# Patient Record
Sex: Male | Born: 1990 | Race: White | Hispanic: No | Marital: Married | Smoking: Former smoker
Health system: Southern US, Community
[De-identification: ages and names within clinical notes are randomized; demographics above are authoritative.]

## PROBLEM LIST (undated history)

## (undated) DIAGNOSIS — E785 Hyperlipidemia, unspecified: Secondary | ICD-10-CM

## (undated) DIAGNOSIS — J45909 Unspecified asthma, uncomplicated: Secondary | ICD-10-CM

## (undated) DIAGNOSIS — T1491XA Suicide attempt, initial encounter: Secondary | ICD-10-CM

## (undated) DIAGNOSIS — R413 Other amnesia: Secondary | ICD-10-CM

## (undated) DIAGNOSIS — G9332 Myalgic encephalomyelitis/chronic fatigue syndrome: Secondary | ICD-10-CM

## (undated) DIAGNOSIS — F319 Bipolar disorder, unspecified: Secondary | ICD-10-CM

## (undated) DIAGNOSIS — IMO0002 Reserved for concepts with insufficient information to code with codable children: Secondary | ICD-10-CM

## (undated) DIAGNOSIS — K589 Irritable bowel syndrome without diarrhea: Secondary | ICD-10-CM

## (undated) DIAGNOSIS — F909 Attention-deficit hyperactivity disorder, unspecified type: Secondary | ICD-10-CM

## (undated) DIAGNOSIS — F32A Depression, unspecified: Secondary | ICD-10-CM

## (undated) DIAGNOSIS — I471 Supraventricular tachycardia, unspecified: Secondary | ICD-10-CM

## (undated) DIAGNOSIS — Q249 Congenital malformation of heart, unspecified: Secondary | ICD-10-CM

## (undated) DIAGNOSIS — F329 Major depressive disorder, single episode, unspecified: Secondary | ICD-10-CM

## (undated) DIAGNOSIS — K2270 Barrett's esophagus without dysplasia: Secondary | ICD-10-CM

## (undated) HISTORY — PX: ABLATION: SHX5711

## (undated) HISTORY — DX: Congenital malformation of heart, unspecified: Q24.9

## (undated) HISTORY — DX: Major depressive disorder, single episode, unspecified: F32.9

## (undated) HISTORY — DX: Depression, unspecified: F32.A

## (undated) HISTORY — DX: Irritable bowel syndrome, unspecified: K58.9

## (undated) HISTORY — DX: Attention-deficit hyperactivity disorder, unspecified type: F90.9

## (undated) HISTORY — DX: Suicide attempt, initial encounter (CMS-HCC): T14.91XA

## (undated) HISTORY — DX: Unspecified asthma, uncomplicated: J45.909

## (undated) HISTORY — DX: Myalgic encephalomyelitis/chronic fatigue syndrome: G93.32

## (undated) HISTORY — DX: Other amnesia: R41.3

## (undated) HISTORY — DX: Reserved for concepts with insufficient information to code with codable children: IMO0002

## (undated) HISTORY — DX: Bipolar disorder, unspecified (CMS-HCC): F31.9

## (undated) HISTORY — DX: Hyperlipidemia, unspecified: E78.5

---

## 2017-06-11 NOTE — Progress Notes (Signed)
06-11-2017 Rcvd ER ref via [facility] in-basket for testicular pain, epididymal cyst seen in [facility] today. Has [employer]. Will see his PCM first.-Myrna N

## 2017-06-16 ENCOUNTER — Ambulatory Visit
Admit: 2017-06-16 | Discharge: 2017-06-16 | Payer: PRIVATE HEALTH INSURANCE | Attending: Gerontology | Primary: Family Medicine

## 2017-06-16 ENCOUNTER — Ambulatory Visit: Attending: Gerontology | Primary: Family Medicine

## 2017-06-16 DIAGNOSIS — N5082 Scrotal pain: Secondary | ICD-10-CM

## 2017-06-16 NOTE — Progress Notes (Signed)
1. Have you been to the ER, urgent care clinic since your last visit?  Hospitalized since your last visit?Yes When: LAST WEEK [facility]    2. Have you seen or consulted any other health care providers outside of the [facility] since your last visit?  Include any pap smears or colon screening. No    Chief Complaint   Patient presents with   ??? Penis Pain     seen last week at [facility]      Visit Vitals  BP 109/66 (BP 1 Location: Left arm, BP Patient Position: At rest)   Pulse 68   Temp 97.1 ??F (36.2 ??C)   Resp 16   Ht 5' 8" (1.727 m)   Wt 184 lb (83.5 kg)   SpO2 98%   BMI 27.98 kg/m??

## 2017-06-16 NOTE — Progress Notes (Signed)
[address]               [place], [place] [address]               [phone]      Casey Koch is a 27 y.o. male and presents with Penis Pain (seen last week at [facility] )         Subjective:    Scrotal pain  After driving for about an hour and felt pain in right scrotum and it shot up to his chest and down his leg  Has had this in the past, but this time the pain continues  The pain waxes and wanes  Went to ED on 06/11/17  US showed bilat 4mm epididymal cysts  Having difficulty urinating, this has been on going years, at least three  Has to bear down to urinate, but does not have to if he sits  Urine stream starts out weak and get strong  Urine is yellow in color  Endorses erection difficulty, able to ejaculate without problem  Has had ghonnorhea and chlamydia during early 20's 21-22yo  Unable to assess for difficulty defecating due to his PMH crohns    Cysts  Gets them frequenly in the pelvic and genital area  Currently has two, one is pink with no pain or drainage  Has been getting them since he was a child      Crohns  Having bloody stools  This is nothing new, it is due to his crohns per his report  States it is not an excessive worrisome amount of blooe  pain with bowel movements that has been for years due to his crohns        ROS:  As stated in HPI, otherwise all others negative.        ROS    The problem list was updated as a part of today's visit.  Patient Active Problem List   Diagnosis Code   ??? Crohn disease (HCC) K50.90   ??? Bipolar 1 disorder (HCC) F31.9   ??? SVT (supraventricular tachycardia) (HCC) I47.1   ??? Barrett esophagus K22.70   ??? PTSD (post-traumatic stress disorder) F43.10   ??? Sleep apnea G47.30   ??? Epidermoid cyst of skin L72.0   ??? Epididymal cyst N50.3       The PSH, FH were reviewed.      SH:  Social History     Tobacco Use   ??? Smoking status: Never Smoker   ??? Smokeless tobacco: Never Used   Substance Use Topics   ??? Alcohol use: Yes     Alcohol/week: 1.2 oz      Types: 2 Glasses of wine per week     Comment: SOCIALLY    ??? Drug use: Yes     Frequency: 7.0 times per week     Types: Marijuana       Medications/Allergies:  Current Outpatient Medications on File Prior to Visit   Medication Sig Dispense Refill   ??? ziprasidone (GEODON) 20 mg capsule Take 20 mg by mouth two (2) times daily (with meals).     ??? propranolol (INDERAL) 60 mg tablet Take 60 mg by mouth three (3) times daily. Indications: Prevention of Post Cardio-Thoracic Surgery Atrial Fibrillation     ??? pantoprazole (PROTONIX) 40 mg tablet Take 40 mg by mouth daily.     ??? diclofenac (VOLTAREN) 1 % gel Apply  to affected area four (4) times daily.  No current facility-administered medications on file prior to visit.         Allergies   Allergen Reactions   ??? Beef Containing Products Anaphylaxis   ??? Pork Derived (Porcine) Anaphylaxis   ??? Bee Pollen Rash   ??? Ibuprofen Nausea and Vomiting     Objective:  Visit Vitals  BP 109/66 (BP 1 Location: Left arm, BP Patient Position: At rest)   Pulse 68   Temp 97.1 ??F (36.2 ??C)   Resp 16   Ht  (1.727 m)   Wt 184 lb (83.5 kg)   SpO2 98%   BMI 27.98 kg/m??    Body mass index is 27.98 kg/m??.    Physical assessment  Physical Exam   Constitutional: He is oriented to person, place, and time and well-developed, well-nourished, and in no distress. Vital signs are normal.   HENT:   Head: Normocephalic and atraumatic.   Cardiovascular: Normal rate, regular rhythm and normal heart sounds.   Pulmonary/Chest: Effort normal and breath sounds normal.   Neurological: He is alert and oriented to person, place, and time. Gait normal.   Skin: Skin is warm, dry and intact.        Nursing note and vitals reviewed.        Labwork and Ancillary Studies:    CBC w/Diff  No results found for: WBC, WBCLT, HGB, HGBP, PLT, HGBEXT, PLTEXT, HGBEXT, PLTEXT      Basic Metabolic Profile  No results found for: NA, K, CL, CO2, AGAP, GLU, BUN, CREA, BUCR, GFRAA, GFRNA, CA, GFRAA     Cholesterol   No results found for: CHOL, CHOLX, CHLST, CHOLV, HDL, LDL, LDLC, DLDLP, TGLX, TRIGL, TRIGP, CHHD, CHHDX    Health Maintenance:   Health Maintenance   Topic Date Due   ??? DTaP/Tdap/Td series (1 - Tdap) 04/15/2011   ??? Influenza Age 52 to Adult  08/27/2017   ??? Pneumococcal 0-64 years  Aged Out       Assessment/Plan:    Diagnoses and all orders for this visit:    1. Scrotal pain  -     REFERRAL TO UROLOGY  -refer to urology for further management  -he also verbalized symptoms that could be related to prostate problems  -  2. Crohn's disease with rectal bleeding, unspecified gastrointestinal tract location [facility])   -this is a common problem for him, the bleeding at this time is not significant per his report, he does not think he needs to go to GI at this time    3. Epidermoid cyst of skin  Comments:  gets them frequently in the pelvic and perineum ever since he was a child  -these could be a cutaneous manifestatio of his crohn's disease  -refer to dermatology if he wishes to have them removed or they become bothersome    4. Epididymal cyst  Comments:  bilat 4mm cyst found on [place]  -found on [place] in ED          I have discussed the diagnosis with the patient and the intended plan as seen in the above orders.  The patient has received an After-Visit Summary and questions were answered concerning future plans.     An After Visit Summary was printed and given to the patient.    All diagnosis have been discussed with the patient and all of the patient's questions have been answered.     Follow-up and Dispositions    ?? Return if symptoms worsen or fail to improve.  Greater than 50% of the time was spent in counseling and coordination of care.       Manon Hilding, AGNP-BC  [facility]  [facility]   [address]  [place]  [address]

## 2017-06-16 NOTE — Progress Notes (Signed)
Progress Notes by Esperanza Sheets, NP at 06/16/17 1300                Author: Esperanza Sheets, NP  Service: --  Author Type: Nurse Practitioner       Filed: 06/17/17 1907  Encounter Date: 06/16/2017  Status: Signed          Editor: Esperanza Sheets, NP (Nurse Practitioner)                                 [address]                [place], [place] [address]                [phone]         Casey Koch is a 27 y.o.  male and presents with Penis Pain (seen last week at [facility] )             Subjective:      Scrotal pain   After driving for about an hour and felt pain in right scrotum and it shot up to his chest and down his leg   Has had this in the past, but this time the pain continues   The pain waxes and wanes   Went to ED on 06/11/17   US showed bilat 4mm epididymal cysts   Having difficulty urinating, this has been on going years, at least three   Has to bear down to urinate, but does not have to if he sits   Urine stream starts out weak and get strong   Urine is yellow in color   Endorses erection difficulty, able to ejaculate without problem   Has had ghonnorhea and chlamydia during early 20's 21-22yo   Unable to assess for difficulty defecating due to his PMH crohns      Cysts   Gets them frequenly in the pelvic and genital area   Currently has two, one is pink with no pain or drainage   Has been getting them since he was a child         Crohns   Having bloody stools   This is nothing new, it is due to his crohns per his report   States it is not an excessive worrisome amount of blooe   pain with bowel movements that has been for years due to his crohns            ROS:   As stated in HPI, otherwise all others negative.          ROS      The problem list was updated as a part of today's visit.     Patient Active Problem List        Diagnosis  Code         ?  Crohn disease (HCC)  K50.90     ?  Bipolar 1 disorder (HCC)  F31.9     ?  SVT (supraventricular tachycardia) (HCC)  I47.1      ?  Barrett esophagus  K22.70     ?  PTSD (post-traumatic stress disorder)  F43.10     ?  Sleep apnea  G47.30     ?  Epidermoid cyst of skin  L72.0         ?  Epididymal cyst  N50.3  The PSH, FH were reviewed.        SH:     Social History          Tobacco Use         ?  Smoking status:  Never Smoker     ?  Smokeless tobacco:  Never Used       Substance Use Topics         ?  Alcohol use:  Yes              Alcohol/week:  1.2 oz         Types:  2 Glasses of wine per week             Comment: SOCIALLY          ?  Drug use:  Yes              Frequency:  7.0 times per week              Types:  Marijuana           Medications/Allergies:     Current Outpatient Medications on File Prior to Visit          Medication  Sig  Dispense  Refill           ?  ziprasidone (GEODON) 20 mg capsule  Take 20 mg by mouth two (2) times daily (with meals).         ?  propranolol (INDERAL) 60 mg tablet  Take 60 mg by mouth three (3) times daily. Indications: Prevention of Post Cardio-Thoracic Surgery Atrial Fibrillation         ?  pantoprazole (PROTONIX) 40 mg tablet  Take 40 mg by mouth daily.               ?  diclofenac (VOLTAREN) 1 % gel  Apply  to affected area four (4) times daily.              No current facility-administered medications on file prior to visit.               Allergies        Allergen  Reactions         ?  Beef Containing Products  Anaphylaxis     ?  Pork Derived (Porcine)  Anaphylaxis     ?  Bee Pollen  Rash         ?  Ibuprofen  Nausea and Vomiting           Objective:   Visit Vitals      BP  109/66 (BP 1 Location: Left arm, BP Patient Position: At rest)     Pulse  68     Temp  97.1 ??F (36.2 ??C)     Resp  16     Ht   (1.727 m)     Wt  184 lb (83.5 kg)     SpO2  98%        BMI  27.98 kg/m??      Body mass index is 27.98 kg/m??.      Physical assessment   Physical Exam    Constitutional: He is oriented to person, place, and time and well-developed, well-nourished, and in no distress. Vital signs are normal.     HENT:    Head: Normocephalic and atraumatic.    Cardiovascular: Normal rate, regular rhythm and normal heart sounds.    Pulmonary/Chest: Effort normal and breath  sounds normal.    Neurological: He is alert and oriented to person, place, and time. Gait normal.    Skin: Skin is warm, dry and intact.           Nursing note and vitals reviewed.              Labwork and Ancillary Studies:      CBC w/Diff   No results found for: WBC, WBCLT, HGB, HGBP, PLT, HGBEXT, PLTEXT, HGBEXT, PLTEXT        Basic Metabolic Profile   No results found for: NA, K, CL, CO2, AGAP, GLU, BUN, CREA, BUCR, GFRAA, GFRNA, CA, GFRAA       Cholesterol   No results found for: CHOL, CHOLX, CHLST, CHOLV, HDL, LDL, LDLC, DLDLP, TGLX, TRIGL, TRIGP, CHHD, CHHDX      Health Maintenance:      Health Maintenance        Topic  Date Due         ?  DTaP/Tdap/Td series (1 - Tdap)  04/15/2011     ?  Influenza Age 45 to Adult   08/27/2017         ?  Pneumococcal 0-64 years   Aged Out           Assessment/Plan:     Diagnoses and all orders for this visit:      1. Scrotal pain   -     REFERRAL TO UROLOGY   -refer to urology for further management   -he also verbalized symptoms that could be related to prostate problems   -   2. Crohn's disease with rectal bleeding, unspecified gastrointestinal tract location [facility])    -this is a common problem for him, the bleeding at this time is not significant per his report, he does not think he needs to go to GI at this time      3. Epidermoid cyst of skin   Comments:   gets them frequently in the pelvic and perineum ever since he was a child   -these could be a cutaneous manifestatio of his crohn's disease   -refer to dermatology if he wishes to have them removed or they become bothersome      4. Epididymal cyst   Comments:   bilat 4mm cyst found on [place]   -found on [place] in ED               I have discussed the diagnosis with the patient and the intended plan as seen in the above orders.  The patient has received an After-Visit  Summary  and questions were answered concerning future plans.       An After Visit Summary was printed and given to the patient.      All diagnosis have been discussed with the patient and all of the patient's questions have been answered.         Follow-up and Dispositions      ??  Return if symptoms worsen or fail to improve.                Greater than 50% of the time was spent in counseling and coordination of care.          Manon Hilding, AGNP-BC   [facility]  [facility]   [address]  [place]  [address]

## 2017-06-16 NOTE — Progress Notes (Signed)
 1. Have you been to the ER, urgent care clinic since your last visit?  Hospitalized since your last visit?Yes When: LAST WEEK [facility]    2. Have you seen or consulted any other health care providers outside of the [facility] since your last visit?  Include any pap smears or colon screening. No    Chief Complaint   Patient presents with   . Penis Pain     seen last week at [facility]      Visit Vitals  BP 109/66 (BP 1 Location: Left arm, BP Patient Position: At rest)   Pulse 68   Temp 97.1 F (36.2 C)   Resp 16   Ht 5' 8 (1.727 m)   Wt 184 lb (83.5 kg)   SpO2 98%   BMI 27.98 kg/m

## 2017-06-17 DIAGNOSIS — K5090 Crohn's disease, unspecified, without complications: Secondary | ICD-10-CM | POA: Insufficient documentation

## 2017-06-17 DIAGNOSIS — L720 Epidermal cyst: Secondary | ICD-10-CM | POA: Insufficient documentation

## 2017-06-17 DIAGNOSIS — G4730 Sleep apnea, unspecified: Secondary | ICD-10-CM | POA: Insufficient documentation

## 2017-06-17 DIAGNOSIS — K2270 Barrett's esophagus without dysplasia: Secondary | ICD-10-CM | POA: Insufficient documentation

## 2017-06-17 DIAGNOSIS — N503 Cyst of epididymis: Secondary | ICD-10-CM | POA: Insufficient documentation

## 2017-06-23 ENCOUNTER — Ambulatory Visit: Admit: 2017-06-23 | Discharge: 2017-06-23 | Attending: Urology | Primary: Family Medicine

## 2017-06-23 ENCOUNTER — Ambulatory Visit: Attending: Urology | Primary: Family Medicine

## 2017-06-23 DIAGNOSIS — N50819 Testicular pain, unspecified: Secondary | ICD-10-CM

## 2017-06-23 NOTE — Progress Notes (Signed)
06/23/2017    Casey Koch  12/15/1990       ASSESSMENT:     ICD-10-CM ICD-9-CM    1. Orchalgia N50.819 608.9 REFERRAL TO PHYSICAL THERAPY ([facility])   2. Dysfunctional voiding of urine N39.8 599.9             PLAN:    Discussed benign exam and unremarkable [place] results.  Epididymal cysts are not the source of his pain.  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  Explained the role of PFPT in correcting the underlying pelvic floor issue.  Pt is amenable to attempting PFPT.  Order submitted.  Will follow up in 3 months if no improvement in symptoms.  Check PVR at next visit.      DISCUSSION:   Went into great depth about the pathophysiology involved in voiding dysfunction and pelvic floor tension.  Suspect that ongoing pelvic floor tension has led to this myofascial pain caused by chronically tightened muscles in and around the pelvis. Our natural protective instincts can tighten the pelvic basin, causing pain and other perplexing and distressing symptoms. Stress is intimately involved in creating and continuing these symptoms. Once the condition starts, the symptoms tend to have a life of their own.  A patient may feel generalized pain and discomfort around the pelvis, though pain may also be in and around the penis/testes, anal area or lower back. The pain or discomfort may be constant, or it may come and go. Sometimes the sensations are more severe during urination.     Reassured pt that there are many men with similar pelvic floor dysfunction.  There are several approaches to treating neuromuscular related pelvic pain caused by chronic contraction or spasm of the muscles of the pelvic floor. This contraction is perpetuated by a self feeding cycle of tension, anxiety, pain and protective guarding.  So, the first step in treatment is to relax and try not to focus on the pelvic symptoms.   Pt may attempt anti-inflammatories but the most effective treatment modality is likely PFPT (applying heat, pressure and/or massage to painful areas, and incorporating special exercises which can help stretch and strengthen the pelvic floor muscles).  Will re-evaluate after PT efforts.         Chief Complaint   Patient presents with   ??? Groin Pain     Pt was seen at [facility] for testicular pain and was informed that he had epididymal cyst. Feels like something is grabbing testicles and pulling. Other times pulsing, pressure. Pt states that with urination he will have to push real hard. Denies seeing any blood in his urine. Denies any trauma to the penies.        HISTORY OF PRESENT ILLNESS:  Casey Koch is a 27 y.o. Caucasian male who presents today for testicular pain x several months and recent disagnosis of epididymal cysts via [place]. Presented to ED on 06/11/17 c/o acute onset Rt testicular pain that began while driving that day. Scrotal US in ED showed bilateral epididymal head cysts, otherwise no abnormality. UA was negative for infection. Discharged on tramadol for his pain.  Reports some intermittent nausea associated with this pain. Typically pain lasts several hours and is intermittent in nature.  Described as aching/throbbing in quality.  Denies gross hematuria, fevers, chills, vomiting or bladder/perineal/flank pain.  No pelvic trauma reported.     Pt states that this pain has been occurring for several months but, prior to  06/11/17, has been very transient in nature, typically only occurring for several minutes at a time and spontaneously resolving without intervention.   Initially Rt sided but evolved to include bilateral testes.  He denies aggravating or alleviating factors and is unable to recall any precipitating events.  No increase in pain with voiding efforts; however he has experienced straining to void for most of his adult life (his "normal").  He states that he often has to push to get his  stream started but once it begins, he can empty well.  Denies pain with voiding.  Denies incontinence.  No reported UTIs as an adult.  No prior instrumentation or GU procedures in the past.    ??    REVIEW OF SYSTEMS:  Constitutional: Fever: No  Skin: Rash: Yes  left arm  HEENT: Hearing difficulty: No  Eyes: Blurred vision: No  Cardiovascular: Chest pain: No  Respiratory: Shortness of breath: Yes  Gastrointestinal: Nausea/vomiting: No  Musculoskeletal: Back pain: No  Neurological: Weakness: No  Psychological: Memory loss: No  Comments/additional findings:        Past Medical History:   Diagnosis Date   ??? Barrett esophagus    ??? Bipolar 1 disorder (HCC)    ??? Crohn disease (HCC)    ??? Crohn's disease (HCC)    ??? History of myocardial infarction 2018   ??? PTSD (post-traumatic stress disorder)    ??? Sleep apnea    ??? SVT (supraventricular tachycardia) (HCC)        Past Surgical History:   Procedure Laterality Date   ??? HX SVT ABLATION  2017   ??? HX SVT ABLATION         Social History     Tobacco Use   ??? Smoking status: Never Smoker   ??? Smokeless tobacco: Never Used   Substance Use Topics   ??? Alcohol use: Yes     Alcohol/week: 1.2 oz     Types: 2 Glasses of wine per week     Comment: SOCIALLY    ??? Drug use: Yes     Frequency: 7.0 times per week     Types: Marijuana       Allergies   Allergen Reactions   ??? Beef Containing Products Anaphylaxis   ??? Pork Derived (Porcine) Anaphylaxis   ??? Bee Pollen Rash   ??? Ibuprofen Nausea and Vomiting   ??? Nsaids (Non-Steroidal Anti-Inflammatory Drug) Nausea Only       Family History   Problem Relation Age of Onset   ??? Psychiatric Disorder Mother    ??? Alcohol abuse Mother    ??? Diabetes Father    ??? Asthma Father    ??? Migraines Father    ??? No Known Problems Brother        Current Outpatient Medications   Medication Sig Dispense Refill   ??? ziprasidone (GEODON) 20 mg capsule Take 20 mg by mouth two (2) times daily (with meals).     ??? propranolol (INDERAL) 60 mg tablet Take 60 mg by mouth three (3) times  daily. Indications: Prevention of Post Cardio-Thoracic Surgery Atrial Fibrillation     ??? pantoprazole (PROTONIX) 40 mg tablet Take 40 mg by mouth daily.     ??? diclofenac (VOLTAREN) 1 % gel Apply  to affected area four (4) times daily.           PHYSICAL EXAMINATION:   Visit Vitals  BP 120/74   Ht  (1.727 m)   Wt 184 lb (83.5 kg)  BMI 27.98 kg/m??     Constitutional: WDWN, Pleasant and appropriate affect, No acute distress.    CV:  No peripheral swelling noted  Respiratory: No respiratory distress or difficulties  Abdomen:  No abdominal tenderness. No CVA tenderness. No inguinal hernias noted.   GU Male:   SCROTUM:  No scrotal rash or lesions noticed.  Normal bilateral testes and epididymis. Lf epididymal cyst palpable, nttp; could not discern Rt epididymal cyst.  PENIS: Urethral meatus normal in location and size. No urethral discharge.  Skin: No jaundice.    Neuro/Psych:  Alert and oriented x 3, affect appropriate.   No inguinal adenopathy    REVIEW OF LABS AND IMAGING:      Scrotal US 06/11/17  EXAM: TESTICULAR ULTRASOUND  CLINICAL INDICATION/HISTORY: PAIN  ????> Additional: None  COMPARISON: None  _______________  FINDINGS:  RIGHT:   ????> Testicle: 3.8 x 3.0 x 1.6 cm. Normal echogenicity. No mass. Color and spectral Doppler demonstrate normal flow to the right testicle with no evidence of torsion. Arterial and venous waveforms are normal.  ????> Epididymis: There is a 4 mm epididymal head cyst.   ????> Hydrocele: None.   ????> Varicocele: None.  LEFT:   ????> Testicle: 4.0 x 3.1 x 1.7 cm. Normal echogenicity. No mass. Color and spectral Doppler demonstrate normal flow to the left testicle with no evidence of torsion. Arterial and venous waveforms are normal.  ????> Epididymis: There is a 4 mm epididymal head cyst.  ????> Hydrocele: None.   ????> Varicocele: None.  OTHER:   ????> No extratesticular scrotal mass.   ????> No scrotal wall edema.  ______________  IMPRESSION  Bilateral epididymal head cysts. No other abnormality.         A copy of today's office visit with all pertinent imaging results and labs were sent to the referring physician.      Patient's BMI is out of the normal parameters.  Information about BMI was given and patient was advised to follow-up with their PCP for further management.      Berenda Morale, PA     I have independently seen and examined the aformentioned patient. I agree with the history, physical, assesment and plan as documented above. Futheremore, I have addended the history, physical, assesment and plan above as needed in order to reflect my additional findings.   Jamey Reas, MD

## 2017-06-23 NOTE — Progress Notes (Signed)
Progress Notes by Jamey Reas, MD at 06/23/17 1100                Author: Jamey Reas, MD  Service: --  Author Type: Physician       Filed: 06/24/17 1220  Encounter Date: 06/23/2017  Status: Signed          Editor: Jamey Reas, MD (Physician)                    06/23/2017      Casey Koch   Jun 14, 1990          ASSESSMENT:              ICD-10-CM  ICD-9-CM             1.  Orchalgia  N50.819  608.9  REFERRAL TO PHYSICAL THERAPY ([facility])           2.  Dysfunctional voiding of urine  N39.8  599.9                    PLAN:     Discussed benign exam and unremarkable [place] results.  Epididymal cysts are not the source of his pain.  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  Explained the role of PFPT in correcting  the underlying pelvic floor issue.  Pt is amenable to attempting PFPT.  Order submitted.  Will follow up in 3 months if no improvement in symptoms.  Check PVR at next visit.         DISCUSSION:    Went into great depth about the pathophysiology involved in voiding dysfunction and pelvic floor tension.  Suspect that ongoing pelvic floor tension has led to this myofascial pain  caused by chronically tightened muscles in and around the pelvis. Our natural protective instincts can tighten the pelvic basin, causing pain and other perplexing and distressing symptoms. Stress is intimately involved in creating and continuing these  symptoms. Once the condition starts, the symptoms tend to have a life of their own.  A patient may feel generalized pain and discomfort around the pelvis, though pain may also be in and around the penis/testes, anal area or lower back. The pain or discomfort  may be constant, or it may come and go. Sometimes the sensations are more severe during urination.       Reassured pt that there are many men with similar pelvic floor dysfunction.  There are several approaches to treating neuromuscular related  pelvic pain caused by chronic contraction or spasm of the muscles of the pelvic floor. This contraction is perpetuated  by a self feeding cycle of tension, anxiety, pain and protective guarding.  So, the first step in treatment is to relax and try not to focus on the pelvic symptoms.  Pt may attempt anti-inflammatories but the most effective treatment modality is likely  PFPT (applying heat, pressure and/or massage to painful areas, and incorporating special exercises which can help stretch and strengthen the pelvic floor muscles).  Will re-evaluate after PT efforts.               Chief Complaint       Patient presents with        ?  Groin Pain             Pt was seen at [facility] for testicular pain and was informed that he had epididymal cyst. Feels  like something is grabbing testicles and pulling. Other  times pulsing, pressure. Pt states that with urination he will have to push real hard. Denies seeing any blood in his urine. Denies any trauma to the penies.            HISTORY OF PRESENT ILLNESS:  Casey Koch  is a 27 y.o. Caucasian male who  presents today for testicular pain x several months and recent disagnosis of epididymal cysts via [place]. Presented to ED on 06/11/17 c/o acute onset Rt testicular pain that began while driving that day. Scrotal US in ED showed bilateral epididymal head cysts,  otherwise no abnormality. UA was negative for infection. Discharged on tramadol for his pain.  Reports some intermittent nausea associated with this pain. Typically pain lasts several hours and is intermittent in nature.  Described as aching/throbbing  in quality.  Denies gross hematuria, fevers, chills, vomiting or bladder/perineal/flank pain.  No pelvic trauma reported.       Pt states that this pain has been occurring for several months but, prior to 06/11/17, has been very transient in nature, typically only occurring for several minutes at a time and spontaneously resolving without intervention.   Initially Rt sided but   evolved to include bilateral testes.  He denies aggravating or alleviating factors and is unable to recall any precipitating events.  No increase in pain with voiding efforts; however he has experienced straining to void for most of his adult life (his  "normal").  He states that he often has to push to get his stream started but once it begins, he can empty well.  Denies pain with voiding.  Denies incontinence.  No reported UTIs as an adult.  No prior instrumentation or GU procedures in the past.     ??      REVIEW OF SYSTEMS:   Constitutional: Fever: No   Skin: Rash: Yes   left arm   HEENT: Hearing difficulty: No   Eyes: Blurred vision: No   Cardiovascular: Chest pain: No   Respiratory: Shortness of breath: Yes   Gastrointestinal: Nausea/vomiting: No   Musculoskeletal: Back pain: No   Neurological: Weakness: No   Psychological: Memory loss: No   Comments/additional findings:             Past Medical History:        Diagnosis  Date         ?  Barrett esophagus       ?  Bipolar 1 disorder (HCC)       ?  Crohn disease (HCC)       ?  Crohn's disease (HCC)       ?  History of myocardial infarction  2018     ?  PTSD (post-traumatic stress disorder)       ?  Sleep apnea           ?  SVT (supraventricular tachycardia) (HCC)               Past Surgical History:         Procedure  Laterality  Date          ?  HX SVT ABLATION    2017          ?  HX SVT ABLATION                 Social History          Tobacco Use         ?  Smoking status:  Never Smoker     ?  Smokeless tobacco:  Never Used       Substance Use Topics         ?  Alcohol use:  Yes              Alcohol/week:  1.2 oz         Types:  2 Glasses of wine per week             Comment: SOCIALLY          ?  Drug use:  Yes              Frequency:  7.0 times per week              Types:  Marijuana             Allergies        Allergen  Reactions         ?  Beef Containing Products  Anaphylaxis     ?  Pork Derived (Porcine)  Anaphylaxis     ?  Bee Pollen  Rash     ?   Ibuprofen  Nausea and Vomiting         ?  Nsaids (Non-Steroidal Anti-Inflammatory Drug)  Nausea Only             Family History         Problem  Relation  Age of Onset          ?  Psychiatric Disorder  Mother       ?  Alcohol abuse  Mother       ?  Diabetes  Father       ?  Asthma  Father       ?  Migraines  Father            ?  No Known Problems  Brother               Current Outpatient Medications          Medication  Sig  Dispense  Refill           ?  ziprasidone (GEODON) 20 mg capsule  Take 20 mg by mouth two (2) times daily (with meals).         ?  propranolol (INDERAL) 60 mg tablet  Take 60 mg by mouth three (3) times daily. Indications: Prevention of Post Cardio-Thoracic Surgery Atrial Fibrillation         ?  pantoprazole (PROTONIX) 40 mg tablet  Take 40 mg by mouth daily.               ?  diclofenac (VOLTAREN) 1 % gel  Apply  to affected area four (4) times daily.                  PHYSICAL EXAMINATION:    Visit Vitals      BP  120/74     Ht   (1.727 m)     Wt  184 lb (83.5 kg)        BMI  27.98 kg/m??        Constitutional: WDWN, Pleasant and appropriate affect, No acute distress .     CV:  No peripheral swelling noted   Respiratory: No respiratory distress or difficulties   Abdomen:  No abdominal tenderness. No CVA tenderness. No inguinal hernias  noted.    GU Male:  SCROTUM:  No scrotal rash or lesions noticed.  Normal bilateral  testes and epididymis. Lf epididymal cyst palpable, nttp; could not discern Rt epididymal cyst.   PENIS: Urethral meatus normal in location and size. No urethral discharge .   Skin: No jaundice.     Neuro/Psych:  Alert and o riented x 3, affect appropriate.    No inguinal adenopathy      REVIEW OF LABS AND IMAGING:        Scrotal US 06/11/17   EXAM: TESTICULAR ULTRASOUND  CLINICAL INDICATION/HISTORY: PAIN  ????> Additional: None  COMPARISON: None  _______________  FINDINGS:  RIGHT:   ????> Testicle: 3.8  x 3.0 x 1.6 cm. Normal echogenicity. No mass. Color and spectral Doppler  demonstrate normal flow to the right testicle with no evidence of torsion. Arterial and venous waveforms are normal.  ????> Epididymis: There is a 4 mm epididymal head cyst.    ????> Hydrocele: None.   ????> Varicocele: None.  LEFT:   ????> Testicle: 4.0 x 3.1 x 1.7 cm. Normal echogenicity. No mass. Color and spectral Doppler demonstrate normal flow to the left testicle with no evidence of torsion. Arterial  and venous waveforms are normal.  ????> Epididymis: There is a 4 mm epididymal head cyst.  ????> Hydrocele: None.   ????> Varicocele: None.  OTHER:   ????> No extratesticular scrotal mass.   ????> No scrotal  wall edema.  ______________  IMPRESSION  Bilateral epididymal head cysts. No other abnormality.            A copy of today's office visit with all pertinent imaging results and labs were sent to the referring physician.        Patient's BMI is out of the normal parameters.  Information about BMI was given and patient was advised to follow-up with their PCP for further management.         Berenda Morale, PA       I have independently seen and examined the aformentioned patient. I agree with the history, physical, assesment and plan as documented above. [place], I have addended the history,  physical, assesment and plan above as needed in order to reflect my additional findings.    Jamey Reas, MD

## 2017-07-09 ENCOUNTER — Ambulatory Visit
Admit: 2017-07-09 | Discharge: 2017-07-09 | Payer: PRIVATE HEALTH INSURANCE | Attending: Family Medicine | Primary: Family Medicine

## 2017-07-09 DIAGNOSIS — F319 Bipolar disorder, unspecified: Secondary | ICD-10-CM

## 2017-07-09 MED ORDER — ZIPRASIDONE 20 MG CAP
20 mg | ORAL_CAPSULE | Freq: Two times a day (BID) | ORAL | 2 refills | Status: DC
Start: 2017-07-09 — End: 2017-07-15

## 2017-07-09 MED ORDER — DICLOFENAC 1 % TOPICAL GEL
1 % | Freq: Four times a day (QID) | CUTANEOUS | 3 refills | Status: DC
Start: 2017-07-09 — End: 2017-07-15

## 2017-07-09 NOTE — Progress Notes (Signed)
[facility]    CC: EOC    HPI:     Cardiac Arrhythmia:  -Cardiac ablation done 2017 due to SVT  -Has continued to have issues with a high HR that is associated with lightheadedness  -Work-up done showed PACs and an arrhythmia  -Has been raking propranolol for issue  -Last saw cardiologist in beginning of March      L2-3 and L5 Compression Fractures:  -2/2 falling out of window in 2015.  -Has had chronic low back pain since then  -Using Voltaren gel at night for issue  -Medication helps some      Bipolar   -Reported as being mixed. Having issue with both mania and depression   -Diagnosed in 2006  -Taking Geodon for issue  -Denies any side effects or issues with the medication  -Reports medication works well  -Currently not seeing therapist/psychiatrist/counselor      ROS: Positive items marked in RED  CON: fever, chills  Cardiovascular: palpitations, CP  Resp: SOB, cough  GI: nausea, vomiting, diarrhea  GU: dysuria, hematuria      Past Medical History:   Diagnosis Date   ??? Barrett esophagus    ??? Bipolar 1 disorder (HCC)    ??? Crohn disease (HCC)    ??? Crohn's disease (HCC)    ??? History of myocardial infarction 2018   ??? PTSD (post-traumatic stress disorder)    ??? Sleep apnea    ??? SVT (supraventricular tachycardia) (HCC)        Past Surgical History:   Procedure Laterality Date   ??? HX SVT ABLATION  2017   ??? HX SVT ABLATION         Family History   Problem Relation Age of Onset   ??? Psychiatric Disorder Mother    ??? Alcohol abuse Mother    ??? Diabetes Father    ??? Asthma Father    ??? Migraines Father    ??? No Known Problems Brother        Social History     Socioeconomic History   ??? Marital status: MARRIED     Spouse name: Not on file   ??? Number of children: Not on file   ??? Years of education: Not on file   ??? Highest education level: Not on file   Tobacco Use   ??? Smoking status: Never Smoker   ??? Smokeless tobacco: Never Used   Substance and Sexual Activity   ??? Alcohol use: Yes     Alcohol/week: 1.2 oz      Types: 2 Glasses of wine per week     Comment: SOCIALLY    ??? Drug use: Yes     Frequency: 7.0 times per week     Types: Marijuana   ??? Sexual activity: Yes     Partners: Male     Birth control/protection: None   Social History Narrative    ** Merged History Encounter **            Allergies   Allergen Reactions   ??? Bee Sting [Sting, Bee] Anaphylaxis     Not Bee Pollen   ??? Beef Containing Products Anaphylaxis   ??? Pork Derived (Porcine) Anaphylaxis   ??? Bee Pollen Rash   ??? Ibuprofen Nausea and Vomiting   ??? Nsaids (Non-Steroidal Anti-Inflammatory Drug) Nausea Only         Current Outpatient Medications:   ???  ziprasidone (GEODON) 20 mg capsule, Take 20 mg by mouth two (2) times daily (  with meals)., Disp: , Rfl:   ???  pantoprazole (PROTONIX) 40 mg tablet, Take 40 mg by mouth daily., Disp: , Rfl:   ???  diclofenac (VOLTAREN) 1 % gel, Apply  to affected area four (4) times daily., Disp: , Rfl:   ???  propranolol (INDERAL) 60 mg tablet, Take 60 mg by mouth three (3) times daily. Indications: Prevention of Post Cardio-Thoracic Surgery Atrial Fibrillation, Disp: , Rfl:     Physical Exam:      BP 128/81    Pulse 62    Temp 97.3 ??F (36.3 ??C) (Oral)    Resp (!) 63    Ht 5\' 8"  (1.727 m)    Wt 184 lb (83.5 kg)    SpO2 95%    BMI 27.98 kg/m??     General:  WD, WN, NAD, conversant  Eyes: sclera clear bilaterally, no discharge noted, eyelids normal in appearance  HENT: NCAT  Lungs: CTAB, normal respiratory effort and rate  CV: RRR, no MRGs  ABD: soft, non-tender, non-distended, normal bowel sounds  Skin: normal temperature, turgor, color, and texture  Psych: alert and oriented to person, place and situation, normal affect  Neuro: speech normal, moving all extremities, gait normal      Assessment/Plan     Bipolar 1 Disorder:  -Advised on importance of seeing mental health specialist  -Will continue current Geodon regimen  -Follow-up in 1 month      Cardiac Arrhythmia:  -Will refer to cardiology for evaluation  -Follow-up in 1 month       Chronic Low Back Pain:  -Will continue current Voltaren gel regimen  -Referred to PMR  -Follow-up in 1 month        Eugenie NorrieHiram Tanyla Stege, MD  07/09/2017, 12:57 PM

## 2017-07-09 NOTE — Progress Notes (Signed)
1. Have you been to the ER, urgent care clinic since your last visit?  Hospitalized since your last visit?No    2. Have you seen or consulted any other health care providers outside of the [facility] since your last visit?  Include any pap smears or colon screening. No

## 2017-07-09 NOTE — Progress Notes (Signed)
1. Have you been to the ER, urgent care clinic since your last visit?  Hospitalized since your last visit?No    2. Have you seen or consulted any other health care providers outside of the [facility] since your last visit?  Include any pap smears or colon screening. No

## 2017-07-09 NOTE — Progress Notes (Signed)
Progress  Notes by Casey Koch at 07/09/17 1230                Author: Eugenie Koch  Service: --  Author Type: Physician       Filed: 07/24/17 0635  Encounter Date: 07/09/2017  Status: Signed          Editor: [facility]      CC: EOC        HPI:        Cardiac Arrhythmia:   -Cardiac ablation done 2017 due to SVT   -Has continued to have issues with a high HR that is associated with lightheadedness   -Work-up done showed PACs and an arrhythmia   -Has been raking propranolol for issue   -Last saw cardiologist in beginning of March         L2-3 and L5 Compression Fractures:   -2/2 falling out of window in 2015.   -Has had chronic low back pain since then   -Using Voltaren gel at night for issue   -Medication helps some         Bipolar    -Reported as being mixed. Having issue with both mania and depression    -Diagnosed in 2006   -Taking Geodon for issue   -Denies any side effects or issues with the medication   -Reports medication works well   -Currently not seeing therapist/psychiatrist/counselor        ROS: Positive items marked in  RED   CON: fever, chills   Cardiovascular: palpitations, CP   Resp: SOB, cough   GI: nausea, vomiting, diarrhea   GU: dysuria, hematuria           Past Medical History:        Diagnosis  Date         ?  Barrett esophagus       ?  Bipolar 1 disorder (HCC)       ?  Crohn disease (HCC)       ?  Crohn's disease (HCC)       ?  History of myocardial infarction  2018     ?  PTSD (post-traumatic stress disorder)       ?  Sleep apnea           ?  SVT (supraventricular tachycardia) (HCC)               Past Surgical History:         Procedure  Laterality  Date          ?  HX SVT ABLATION    2017          ?  HX SVT ABLATION                 Family History         Problem  Relation  Age of Onset          ?  Psychiatric Disorder  Mother       ?  Alcohol abuse  Mother       ?  Diabetes  Father       ?  Asthma  Father       ?  Migraines   Father            ?  No Known Problems  Brother  Social History          Socioeconomic History         ?  Marital status:  MARRIED              Spouse name:  Not on file         ?  Number of children:  Not on file     ?  Years of education:  Not on file     ?  Highest education level:  Not on file       Tobacco Use         ?  Smoking status:  Never Smoker     ?  Smokeless tobacco:  Never Used       Substance and Sexual Activity         ?  Alcohol use:  Yes              Alcohol/week:  1.2 oz         Types:  2 Glasses of wine per week             Comment: SOCIALLY          ?  Drug use:  Yes              Frequency:  7.0 times per week         Types:  Marijuana         ?  Sexual activity:  Yes              Partners:  Male         Birth control/protection:  None       Social History Narrative          ** Merged History Encounter **                        Allergies        Allergen  Reactions         ?  Bee Sting [Sting, Bee]  Anaphylaxis             Not Bee Pollen         ?  Beef Containing Products  Anaphylaxis     ?  Pork Derived (Porcine)  Anaphylaxis     ?  Bee Pollen  Rash     ?  Ibuprofen  Nausea and Vomiting         ?  Nsaids (Non-Steroidal Anti-Inflammatory Drug)  Nausea Only              Current Outpatient Medications:    ?  ziprasidone (GEODON) 20 mg capsule, Take 20 mg by mouth two (2) times daily (with meals)., Disp: , Rfl:    ?  pantoprazole (PROTONIX) 40 mg tablet, Take 40 mg by mouth daily., Disp: , Rfl:    ?  diclofenac (VOLTAREN) 1 % gel, Apply  to affected area four (4) times daily., Disp: , Rfl:    ?  propranolol (INDERAL) 60 mg tablet, Take 60 mg by mouth three (3) times daily. Indications: Prevention of Post Cardio-Thoracic Surgery Atrial Fibrillation, Disp: , Rfl:         Physical Exam:         BP 128/81    Pulse 62    Temp 97.3 ??F (36.3 ??C) (Oral)    Resp (!) 63    Ht 5\' 8"  (1.727 m)    Wt 184 lb (83.5 kg)  SpO2 95%    BMI 27.98 kg/m??       General:  WD, WN, NAD, conversant   Eyes:  sclera clear bilaterally, no discharge noted, eyelids normal in appearance   HENT: NCA T   Lungs: CTAB, normal respiratory effort and rate   CV: RRR, no MRGs   ABD: soft,  non-tender, non-distended, normal bowel sounds   Skin: normal temperature, turgor, color, and texture   Psych: alert and oriented to person, place and situation, normal affect   Neuro: speech normal, moving all extremities, gait normal           Assessment/Plan        Bipolar 1 Disorder:   -Advised on importance of seeing mental health specialist   -Will continue current Geodon regimen   -Follow-up in 1 month         Cardiac Arrhythmia:   -Will refer to cardiology for evaluation   -Follow-up in 1 month         Chronic Low Back Pain:   -Will continue current Voltaren gel regimen   -Referred to PMR   -Follow-up in 1 month            Casey NorrieHiram Abdulrahim Siddiqi, MD   07/09/2017, 12:57 PM

## 2017-07-14 NOTE — Telephone Encounter (Signed)
Patient is requesting all his prescriptions to go to the [facility] on [address]   Phone number [phone].   Call the patient back at [phone].

## 2017-07-15 ENCOUNTER — Encounter

## 2017-07-15 NOTE — Telephone Encounter (Signed)
Patient is requesting all his prescriptions to go to the [facility] on [address]   Phone number [phone].   Call the patient back at [phone].

## 2017-07-15 NOTE — Telephone Encounter (Signed)
Patient called stating cant get out of bed, please forwards his medications accordingly.

## 2017-07-20 MED ORDER — PROPRANOLOL 60 MG TAB
60 mg | ORAL_TABLET | Freq: Every day | ORAL | 1 refills | Status: DC
Start: 2017-07-20 — End: 2017-08-18

## 2017-07-20 MED ORDER — DICLOFENAC 1 % TOPICAL GEL
1 % | Freq: Four times a day (QID) | CUTANEOUS | 3 refills | Status: AC
Start: 2017-07-20 — End: ?

## 2017-07-20 MED ORDER — PANTOPRAZOLE 40 MG TAB, DELAYED RELEASE
40 mg | ORAL_TABLET | Freq: Every day | ORAL | 1 refills | Status: AC
Start: 2017-07-20 — End: ?

## 2017-07-20 MED ORDER — ZIPRASIDONE 20 MG CAP
20 mg | ORAL_CAPSULE | Freq: Two times a day (BID) | ORAL | 1 refills | Status: AC
Start: 2017-07-20 — End: ?

## 2017-07-29 ENCOUNTER — Ambulatory Visit
Admit: 2017-07-29 | Discharge: 2017-07-29 | Attending: Rehabilitative and Restorative Service Providers" | Primary: Family Medicine

## 2017-07-29 ENCOUNTER — Ambulatory Visit: Attending: Adult Health | Primary: Family Medicine

## 2017-07-29 DIAGNOSIS — R102 Pelvic and perineal pain: Secondary | ICD-10-CM

## 2017-07-29 NOTE — Progress Notes (Signed)
Physical Therapy Dysfunctional Voiding Evaluation    Casey Koch, 27 y.o. , male presents today for evaluation.    Past Medical History:   Diagnosis Date   ??? Barrett esophagus    ??? Bipolar 1 disorder (HCC)    ??? Crohn disease (HCC)    ??? Crohn's disease (HCC)    ??? History of myocardial infarction 2018   ??? PTSD (post-traumatic stress disorder)    ??? Sleep apnea    ??? SVT (supraventricular tachycardia) (HCC)        Pt referred by Dr. Nada Boozer with a diagnosis of orchalgia and DV.  Pt's concerns are for L > R testicle pain. Pt reports onset 1 year ago which initially started as random L testicle pain; pain has no progressively worsened with increase in frequency and is now in both testicles though it is still worse on L. Pt also endorses difficulty with initiating urination and has strained to urinate for most of his life. Testicle pain has no pattern of aggravation and can occur randomly. Pt has a history of LBP after sustaining a compression fracture to L2, L3, and L5 in 2015. He was being treated for back pain in [place] prior to moving to the area. 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     Pt drinks 5-6 (64 oz) bottles water per day. Occasional drinks 1 cup/can coffee/soda.    Surgical and Medical History: Compression fracture to L2, L3, and L5 sustained 2015, cardiac ablasion 2017    Social History  working and lives with significant other    Precautions  none     Tests    scrotal US:  Bilateral epididymal head cysts. No other abnormality.      Current urinary complaint  difficulty with initiation of urine flow: with every effort    Bladder complaint longevity:  describes as "lifelong"    Bladder symptom progression:  the same    Pad use:  none, does not require    Pad wetness when changed:  NA    Daytime urinary frequency:  6-10x/ day    Nocturia:  0-2x/ night    Bowel function:  Regular BM, 5-7 per week    Pain complaint:  low back   testicular pain: left > right, post ejaculation    Pain scale:   Verbal Analog scale: average daily pain 3, best day 0, worst pain 9    Pain description:  Quality:  sharp  Behavior:  intermittent  Worsens with: urination, bowel movement and sexual activity (intercourse, ejaculation, orgasm)  Improves with: nothing, medication and lying on his stomach    Functional limitations:  cannot work/exercise without back pain  cannot be intimate with significant other without pain  ADLs limited by urinary urgency and frequency    Physical Exam    Pre Tx Sx: L testicle pain present, TTP to TLJ  Posture: decreased lumbar lordosis  Dermatomes: hyperalgesic to left L3, L4, L5, and right L4 (foot only)  Myotomes: decreased right L3, and left S1  SLR: not tested    LROM:   Flexion: deviates to left, painful arc  Extension: 75% loss, hinges at TLJ, ERP mainly at TLJ, mild to moderate pain at lower lumbar  R Sidebend: 50% limited  L Sidebend: 50% lmited     Movement test:  Prone: decreases testicle pain, better  Repeated lumbar extension in lying (REIL): x8, right L4 dermatome sensation WNL compared to left    Provisional classification:  Posterior  lumbar derangement with symmetric LE symptoms below the knee      External Palpation:  Connective tissue/myofascial restriction lumbar areas  Trigger points/hypertonicity/painful palpation:   left lumbar paraspinals  Lumbar spinous process and at TLJ        Pelvic floor manual exam:  Not tested this visit    Pelvic muscle release pattern  not tested this visit    EMG Evaluation of pelvic floor musculature    Not performed at this visit      Treatment and Education today:  Initiated pt education including anatomy and physiology of pelvic floor and it's relevance to bladder symptoms, pain.  Pt educated on spine and nerve anatomy and its relevance to their symptoms. Edu on centralization and peripheralization and instructed to discontinue exercise if symptoms peripheralize. Patient educated on  posture and using a lumbar roll for sitting to preserve lumbar lordosis. Patient verbalized understanding and agrees to this POC.  Initiated treatment techniques, including external pelvic floor self check and STM PRN, lumbar extension  Initiated HEP, to assist pt in taking an active role in rehabilitation and progress towards goals.  Pt given home program education re:  REIL x10 hourly (4- 5 times a day) in lying, or standing if unable to lie; instructions to discontinue if symptoms worsen.      Ther Ex 15 min  MT 10 min      Assessment  Jaris Kohles presents with the following behavioral/functional findings:   Nocturia  Poor understanding of the body and exercise physiology as it applies to his/her condition   No home exercise program  Poor knowledge of proper posture and body mechanics as it applies to his/her condition  Fitness exercise tolerance limited due to pain  Decreased tolerance to sexual activity due to pain    Rayshon Albaugh presents with the following findings for pelvic orthopedic and skeletal findings:  Musculoskeletal dysfunction that is contributing to pain  Abnormal muscle tension and trigger points contributing to pain and dysfunction    Tibor Lemmons presents with the following findings for pelvic floor motor function:  Not tested this visit due to orthopedic structural dysfunction      A current functional outcome assessment  was performed. The exam for functional outcome deficiencies was positive (abnormal exam).  Plan of care  was done..       Pt will benefit from skilled PT services to address the aforementioned symptoms. Good prognosis for goal achievement with regular therapy attendance and diligent home program completion.       Physical Therapy Short Term Goals to be achieved in 6 weeks:       1. Pt will demonstrate verbal understanding of good dietary habits including adequate fluid intake, dietary considerations for good bladder habits    2. Pt will be independent with HEP for improved  PFM control for contraction and release, improved bladder emptying, and decreased urinary urgency and frequency.  3. Pt will demonstrate proper isolated pelvic floor contraction and/or relaxation for progression towards LTG's      Physical therapy Long Term goals to be achieved in 12  Weeks:    1. Pt will demonstrate verbal understanding of good bladder toileting habits including relaxed voiding, double voiding, urge suppression strategies, bladder diary, timed voiding, and proper hygiene for long term control of condition.  2. Pt to report normalized voiding frequency: daytime intervals of 2-3 hours and 0-1 time per night for a more productive workday and restful sleep.  3. Pt will demonstrate  ability to maintain properly relaxed pelvic floor (below 1-162mV) at rest and during voiding for correction of voiding dysfunction.  4. Pt to demonstrate correction of urinary voiding dysfunction, as demonstrated by maintenance of relaxed pelvic floor on emg during voiding trial, and low PVR.  5. Pt will be independent with HEP for pain control, Relaxation, correction of voiding dysfunction, and improved  PFM control to have long term control of condition.    6. Pt will return to functional ADL's, not limited by urgency, frequency, pain.  7. Pt will return to all social activities, not limited by urgency, frequency, pain.  8. Pt will report ability to sleep restfully through night, not limited by nocturia.  9. Pt will report ability to return to work, not limited by urgency, frequency, pain.  10. Pt will report ability to continue/return to fitness routine, not limited by urgency, frequency, pain.  11. Patient will return to functional ADL's with decreased or no pain, including exercise, sexual activity and work functions.  12. Patient reports decrease of pain by 80- 100% through resolution of pelvic malalignment and instabilities, normalization of flexibility,  resolution of both internal and external muscle spasm and trigger points, and improved motor control and strength of pelvic musculature.        Plan    Ongoing pt education and HEP advancement  Therapeutic exercise  Neuromotor re-education  Manual therapy  Trigger Point Dry Needling  Progressive PME's  Modalities as needed for pain control (US, TENS, IFES, NMES, heat, ice)  Home rental or purchase TENS/IFES  Home rental neuromuscular electric stimulation for pelvic floor  EMG assessment  Biofeedback training  Bladder scan      Frequency:  Once a week or every other week for 12 weeks, with reevaluation every 10th visit        Next visit focus on:  emg  external MT  Assess response to REIL      UDI 6 and IIQ 7  07/29/2017   UDI 1 Freq urination 1   UDI 2 Leak - urgency 0   UDI 3 Leak - activity 0   UDI 4 Small leak 0   UDI 5 Difficulty empty 2   UDI 6 Pain abd or pelvis 2   UDI 6 Score 28   IIQ 1 Affected chores 1   IIQ 2 Affected recreation 1   IIQ 3 Affected entertainment 1   IIQ 4 Affected travel 1   IIQ 5 Affected social 1   IIQ 6 Affected emotional 0   IIQ 7 Frustration 1   IIQ 7 Score 29     CRADI-8 SCORE 07/29/2017   Do you feel you need to strain too hard to have a bowel movement? 2   Do you feel you have not completely emptied your bowels at the end of a bowel movement? 3   Do you usually lose stool beyond your control if your stool is well formed? 0   Do you usually lose stool beyond your control if your stool is loose? 0   Do you usually lose gas from your rectum beyond your control? 0   Do you usually have pain when you pass your stool? 2   Do you experience a strong sense of urgency and have to rush to the bathroom to have a bowel movement? 2   Does a part of your bowel ever pass through the rectum and bulge outside during or after a bowel movement? 0   CRADI-8  Score  9     Chronic Prostatitis Symptom Index for the Past Week 07/29/2017   Pain or discomfort in the area between rectum and testicles (perineum)? 0    Pain or discomfort in the testicles? 1   Pain or discomfort in the tip of the penis (not related to urination)? 0   Pain or discomfort below your waist, in your pubic or bladder area? 1   Pain or burning during urination? 1   Pain or discomfort during or after sexual climax? 1   How often have you had pain or discomfort in any of the above areas? 2   AVERAGE pain or discomfort level on the days you had it? 7   Pain or Discomfort Score 13   Have had a sensation of not emptying your bladder completely? 3   Have had to urinate again less that 2 hrs after urinating? 2   Urinary Symptom Score 5   Symptom Scale Score 18   Have your symptoms kept you from doing things you usually do? 2   How much did you think about your symptoms? 2   How would you feel about these symptoms for the rest of your life? 3   Quality of Life Score 7             Dr. Wyvonnia Dusky is available in clinic as incident to provider.    Time In: 1340  Time Out: 1445  Total Time (min): 65  Total Timed Codes MCR/BCBS (min): NA  Total 1:1 Time (min): NA    Corrinne Eagle PT, DPT, OCS  Physical Therapist  [facility]

## 2017-07-29 NOTE — Progress Notes (Signed)
 Physical Therapy Dysfunctional Voiding Evaluation    Casey Koch, 27 y.o. , male presents today for evaluation.    Past Medical History:   Diagnosis Date   . Barrett esophagus    . Bipolar 1 disorder (HCC)    . Crohn disease (HCC)    . Crohn's disease (HCC)    . History of myocardial infarction 2018   . PTSD (post-traumatic stress disorder)    . Sleep apnea    . SVT (supraventricular tachycardia) (HCC)        Pt referred by Dr. Genora with a diagnosis of orchalgia and DV.  Pt's concerns are for L > R testicle pain. Pt reports onset 1 year ago which initially started as random L testicle pain; pain has no progressively worsened with increase in frequency and is now in both testicles though it is still worse on L. Pt also endorses difficulty with initiating urination and has strained to urinate for most of his life. Testicle pain has no pattern of aggravation and can occur randomly. Pt has a history of LBP after sustaining a compression fracture to L2, L3, and L5 in 2015. He was being treated for back pain in [place]  prior to moving to the area. 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     Pt drinks 5-6 (64 oz) bottles water per day. Occasional drinks 1 cup/can coffee/soda.    Surgical and Medical History: Compression fracture to L2, L3, and L5 sustained 2015, cardiac ablasion 2017    Social History  working and lives with significant other    Precautions  none     Tests    scrotal US :  Bilateral epididymal head cysts. No other abnormality.      Current urinary complaint  difficulty with initiation of urine flow: with every effort    Bladder complaint longevity:  describes as lifelong    Bladder symptom progression:  the same    Pad use:  none, does not require    Pad wetness when changed:  NA    Daytime urinary frequency:  6-10x/ day    Nocturia:  0-2x/ night    Bowel function:  Regular BM, 5-7 per week    Pain complaint:  low back   testicular pain: left > right, post ejaculation    Pain  scale:  Verbal Analog scale: average daily pain 3, best day 0, worst pain 9    Pain description:  Quality:  sharp  Behavior:  intermittent  Worsens with: urination, bowel movement and sexual activity (intercourse, ejaculation, orgasm)  Improves with: nothing, medication and lying on his stomach    Functional limitations:  cannot work/exercise without back pain  cannot be intimate with significant other without pain  ADLs limited by urinary urgency and frequency    Physical Exam    Pre Tx Sx: L testicle pain present, TTP to TLJ  Posture: decreased lumbar lordosis  Dermatomes: hyperalgesic to left L3, L4, L5, and right L4 (foot only)  Myotomes: decreased right L3, and left S1  SLR: not tested    LROM:   Flexion: deviates to left, painful arc  Extension: 75% loss, hinges at TLJ, ERP mainly at TLJ, mild to moderate pain at lower lumbar  R Sidebend: 50% limited  L Sidebend: 50% lmited     Movement test:  Prone: decreases testicle pain, better  Repeated lumbar extension in lying (REIL): x8, right L4 dermatome sensation WNL compared to left    Provisional classification:  Posterior  lumbar derangement with symmetric LE symptoms below the knee      External Palpation:  Connective tissue/myofascial restriction lumbar areas  Trigger points/hypertonicity/painful palpation:   left lumbar paraspinals  Lumbar spinous process and at TLJ        Pelvic floor manual exam:  Not tested this visit    Pelvic muscle release pattern  not tested this visit    EMG Evaluation of pelvic floor musculature    Not performed at this visit      Treatment and Education today:  Initiated pt education including anatomy and physiology of pelvic floor and it's relevance to bladder symptoms, pain.  Pt educated on spine and nerve anatomy and its relevance to their symptoms. Edu on centralization and peripheralization and instructed to discontinue exercise if symptoms peripheralize. Patient educated on posture and using a lumbar roll for sitting to preserve  lumbar lordosis. Patient verbalized understanding and agrees to this POC.  Initiated treatment techniques, including external pelvic floor self check and STM PRN, lumbar extension  Initiated HEP, to assist pt in taking an active role in rehabilitation and progress towards goals.  Pt given home program education re:  REIL x10 hourly (4- 5 times a day) in lying, or standing if unable to lie; instructions to discontinue if symptoms worsen.      Ther Ex 15 min  MT 10 min      Assessment  Casey Koch presents with the following behavioral/functional findings:   Nocturia  Poor understanding of the body and exercise physiology as it applies to his/her condition   No home exercise program  Poor knowledge of proper posture and body mechanics as it applies to his/her condition  Fitness exercise tolerance limited due to pain  Decreased tolerance to sexual activity due to pain    Casey Koch presents with the following findings for pelvic orthopedic and skeletal findings:  Musculoskeletal dysfunction that is contributing to pain  Abnormal muscle tension and trigger points contributing to pain and dysfunction    Casey Koch presents with the following findings for pelvic floor motor function:  Not tested this visit due to orthopedic structural dysfunction      A current functional outcome assessment  was performed. The exam for functional outcome deficiencies was positive (abnormal exam).  Plan of care  was done..       Pt will benefit from skilled PT services to address the aforementioned symptoms. Good prognosis for goal achievement with regular therapy attendance and diligent home program completion.       Physical Therapy Short Term Goals to be achieved in 6 weeks:       1. Pt will demonstrate verbal understanding of good dietary habits including adequate fluid intake, dietary considerations for good bladder habits   2. Pt will be independent with HEP for improved  PFM control for contraction and release, improved  bladder emptying, and decreased urinary urgency and frequency.  3. Pt will demonstrate proper isolated pelvic floor contraction and/or relaxation for progression towards LTG's      Physical therapy Long Term goals to be achieved in 12  Weeks:    1. Pt will demonstrate verbal understanding of good bladder toileting habits including relaxed voiding, double voiding, urge suppression strategies, bladder diary, timed voiding, and proper hygiene for long term control of condition.  2. Pt to report normalized voiding frequency: daytime intervals of 2-3 hours and 0-1 time per night for a more productive workday and restful sleep.  3. Pt will demonstrate  ability to maintain properly relaxed pelvic floor (below 1-21mV) at rest and during voiding for correction of voiding dysfunction.  4. Pt to demonstrate correction of urinary voiding dysfunction, as demonstrated by maintenance of relaxed pelvic floor on emg during voiding trial, and low PVR.  5. Pt will be independent with HEP for pain control, Relaxation, correction of voiding dysfunction, and improved  PFM control to have long term control of condition.    6. Pt will return to functional ADL's, not limited by urgency, frequency, pain.  7. Pt will return to all social activities, not limited by urgency, frequency, pain.  8. Pt will report ability to sleep restfully through night, not limited by nocturia.  9. Pt will report ability to return to work, not limited by urgency, frequency, pain.  10. Pt will report ability to continue/return to fitness routine, not limited by urgency, frequency, pain.  11. Patient will return to functional ADL's with decreased or no pain, including exercise, sexual activity and work functions.  12. Patient reports decrease of pain by 80- 100% through resolution of pelvic malalignment and instabilities, normalization of flexibility, resolution of both internal and external muscle spasm and trigger points, and improved motor control and strength of  pelvic musculature.        Plan    Ongoing pt education and HEP advancement  Therapeutic exercise  Neuromotor re-education  Manual therapy  Trigger Point Dry Needling  Progressive PME's  Modalities as needed for pain control (US , TENS, IFES, NMES, heat, ice)  Home rental or purchase TENS/IFES  Home rental neuromuscular electric stimulation for pelvic floor  EMG assessment  Biofeedback training  Bladder scan      Frequency:  Once a week or every other week for 12 weeks, with reevaluation every 10th visit        Next visit focus on:  emg  external MT  Assess response to REIL      UDI 6 and IIQ 7  07/29/2017   UDI 1 Freq urination 1   UDI 2 Leak - urgency 0   UDI 3 Leak - activity 0   UDI 4 Small leak 0   UDI 5 Difficulty empty 2   UDI 6 Pain abd or pelvis 2   UDI 6 Score 28   IIQ 1 Affected chores 1   IIQ 2 Affected recreation 1   IIQ 3 Affected entertainment 1   IIQ 4 Affected travel 1   IIQ 5 Affected social 1   IIQ 6 Affected emotional 0   IIQ 7 Frustration 1   IIQ 7 Score 29     CRADI-8 SCORE 07/29/2017   Do you feel you need to strain too hard to have a bowel movement? 2   Do you feel you have not completely emptied your bowels at the end of a bowel movement? 3   Do you usually lose stool beyond your control if your stool is well formed? 0   Do you usually lose stool beyond your control if your stool is loose? 0   Do you usually lose gas from your rectum beyond your control? 0   Do you usually have pain when you pass your stool? 2   Do you experience a strong sense of urgency and have to rush to the bathroom to have a bowel movement? 2   Does a part of your bowel ever pass through the rectum and bulge outside during or after a bowel movement? 0   CRADI-8  Score  9     Chronic Prostatitis Symptom Index for the Past Week 07/29/2017   Pain or discomfort in the area between rectum and testicles (perineum)? 0   Pain or discomfort in the testicles? 1   Pain or discomfort in the tip of the penis (not related to urination)? 0    Pain or discomfort below your waist, in your pubic or bladder area? 1   Pain or burning during urination? 1   Pain or discomfort during or after sexual climax? 1   How often have you had pain or discomfort in any of the above areas? 2   AVERAGE pain or discomfort level on the days you had it? 7   Pain or Discomfort Score 13   Have had a sensation of not emptying your bladder completely? 3   Have had to urinate again less that 2 hrs after urinating? 2   Urinary Symptom Score 5   Symptom Scale Score 18   Have your symptoms kept you from doing things you usually do? 2   How much did you think about your symptoms? 2   How would you feel about these symptoms for the rest of your life? 3   Quality of Life Score 7             Dr. Buren Ned is available in clinic as incident to provider.    Time In: 1340  Time Out: 1445  Total Time (min): 65  Total Timed Codes MCR/BCBS (min): NA  Total 1:1 Time (min): NA    Orlean Drape PT, DPT, OCS  Physical Therapist  [facility] 

## 2017-08-07 ENCOUNTER — Ambulatory Visit: Admit: 2017-08-07 | Payer: PRIVATE HEALTH INSURANCE | Attending: Family Medicine | Primary: Family Medicine

## 2017-08-07 ENCOUNTER — Ambulatory Visit: Attending: Family Medicine | Primary: Family Medicine

## 2017-08-07 DIAGNOSIS — F319 Bipolar disorder, unspecified: Secondary | ICD-10-CM

## 2017-08-07 NOTE — Progress Notes (Signed)
[facility]    CC: F/U for chronic disease management    HPI:     Bipolar I:  -Taking Geodon as prescribed  -Denies any side effects or issues with the medication   -Reports that medication works well to control his symptoms  -Not being followed by psychiatry      Cardiac Arrhythmia:  -Taking propanolol as prescribed  -Denies any side effects or issues with the medication  -Reports medication seems to help control his HR  -Has not seen cardiologist      Chronic lower back pain:  -Using Voltaren as prescribed  -Denies any side effects or issues with medication  -Medication helps with pain   -Has not seen PMR      ROS: Positive items marked in RED  CON: fever, chills  Cardiovascular: palpitations, CP  Resp: SOB, cough  GI: nausea, vomiting, diarrhea  GU: dysuria, hematuria      Past Medical History:   Diagnosis Date   ??? Barrett esophagus    ??? Bipolar 1 disorder (HCC)    ??? Crohn's disease (HCC)    ??? History of myocardial infarction 2018   ??? PTSD (post-traumatic stress disorder)    ??? Sleep apnea    ??? SVT (supraventricular tachycardia) (HCC)        Past Surgical History:   Procedure Laterality Date   ??? HX SVT ABLATION  2017   ??? HX SVT ABLATION         Family History   Problem Relation Age of Onset   ??? Psychiatric Disorder Mother    ??? Alcohol abuse Mother    ??? Diabetes Father    ??? Asthma Father    ??? Migraines Father    ??? No Known Problems Brother        Social History     Socioeconomic History   ??? Marital status: MARRIED     Spouse name: Not on file   ??? Number of children: Not on file   ??? Years of education: Not on file   ??? Highest education level: Not on file   Tobacco Use   ??? Smoking status: Never Smoker   ??? Smokeless tobacco: Never Used   Substance and Sexual Activity   ??? Alcohol use: Yes     Alcohol/week: 2.0 standard drinks     Types: 2 Glasses of wine per week     Comment: SOCIALLY    ??? Drug use: Yes     Frequency: 7.0 times per week     Types: Marijuana   ??? Sexual activity: Yes     Partners: Male      Birth control/protection: None   Social History Narrative    ** Merged History Encounter **            Allergies   Allergen Reactions   ??? Bee Sting [Sting, Bee] Anaphylaxis     Not Bee Pollen   ??? Beef Containing Products Anaphylaxis   ??? Pork Derived (Porcine) Anaphylaxis   ??? Bee Pollen Rash   ??? Ibuprofen Nausea and Vomiting   ??? Nsaids (Non-Steroidal Anti-Inflammatory Drug) Nausea Only         Current Outpatient Medications:   ???  ziprasidone (GEODON) 20 mg capsule, Take 1 Cap by mouth two (2) times daily (with meals). Indications: Bipolar I Disorder with Most Recent Episode Mixed, Disp: 180 Cap, Rfl: 1  ???  diclofenac (VOLTAREN) 1 % gel, Apply 2 g to affected area four (4) times daily.,  Disp: 100 g, Rfl: 3  ???  pantoprazole (PROTONIX) 40 mg tablet, Take 1 Tab by mouth daily., Disp: 90 Tab, Rfl: 1  ???  propranolol LA (INDERAL LA) 60 mg SR capsule, Take 1 Cap by mouth daily., Disp: 90 Cap, Rfl: 1    Physical Exam:      BP 119/71    Pulse 70    Temp 97.1 ??F (36.2 ??C) (Oral)    Resp 12    Ht 5\' 8"  (1.727 m)    Wt 183 lb (83 kg)    SpO2 91%    BMI 27.83 kg/m??     General:  WD, WN, NAD, conversant  Eyes: sclera clear bilaterally, no discharge noted, eyelids normal in appearance  HENT: NCAT  Lungs: CTAB, normal respiratory effort and rate  CV: RRR, no MRGs  ABD: soft, non-tender, non-distended, normal bowel sounds  Ext: no peripheral edema or digital cyanosis noted  Skin: normal temperature, turgor, color, and texture  Psych: alert and oriented to person, place and situation, normal affect  Neuro: speech normal, moving all extremities, gait normal      Assessment/Plan     Bipolar 1 Disorder:  -Advised on importance of seeing mental health specialist  -Will continue current Geodon regimen  -Follow-up in 2 month  ??  ??  Cardiac Arrhythmia:  -Encouraged to see cardiology for evaluation  -Will continue current propanolol regimen  -Follow-up in 2 months  ??  ??  Chronic Low Back Pain:  -Will continue current Voltaren gel regimen   -Will defer further management/evaluation to PMR  -Follow-up in 2 months        Eugenie NorrieHiram Landon Bassford, MD  08/07/2017

## 2017-08-07 NOTE — Progress Notes (Signed)
1. Have you been to the ER, urgent care clinic since your last visit?  Hospitalized since your last visit?No    2. Have you seen or consulted any other health care providers outside of the [facility] since your last visit?  Include any pap smears or colon screening. No

## 2017-08-07 NOTE — Progress Notes (Signed)
Progress  Notes by Eugenie Norrie at 08/07/17 1600                Author: Eugenie Norrie  Service: --  Author Type: Physician       Filed: 09/07/17 0102  Encounter Date: 08/07/2017  Status: Signed          Editor: [facility]      CC: F/U for chronic disease management        HPI:        Bipolar I:   -Taking Geodon as prescribed   -Denies any side effects or issues with the medication    -Reports that medication works well to control his symptoms   -Not being followed by psychiatry         Cardiac Arrhythmia:   -Taking propanolol as prescribed   -Denies any side effects or issues with the medication   -Reports medication seems to help control his HR   -Has not seen cardiologist         Chronic lower back pain:   -Using Voltaren as prescribed   -Denies any side effects or issues with medication   -Medication helps with pain    -Has not seen PMR        ROS: Positive items marked in  RED   CON: fever, chills   Cardiovascular: palpitations, CP   Resp: SOB, cough   GI: nausea, vomiting, diarrhea   GU: dysuria, hematuria           Past Medical History:        Diagnosis  Date         ?  Barrett esophagus       ?  Bipolar 1 disorder (HCC)       ?  Crohn's disease (HCC)       ?  History of myocardial infarction  2018     ?  PTSD (post-traumatic stress disorder)       ?  Sleep apnea           ?  SVT (supraventricular tachycardia) (HCC)               Past Surgical History:         Procedure  Laterality  Date          ?  HX SVT ABLATION    2017          ?  HX SVT ABLATION                 Family History         Problem  Relation  Age of Onset          ?  Psychiatric Disorder  Mother       ?  Alcohol abuse  Mother       ?  Diabetes  Father       ?  Asthma  Father       ?  Migraines  Father            ?  No Known Problems  Brother               Social History          Socioeconomic History         ?  Marital status:  MARRIED  Spouse name:  Not on file          ?  Number of children:  Not on file     ?  Years of education:  Not on file     ?  Highest education level:  Not on file       Tobacco Use         ?  Smoking status:  Never Smoker     ?  Smokeless tobacco:  Never Used       Substance and Sexual Activity         ?  Alcohol use:  Yes              Alcohol/week:  2.0 standard drinks         Types:  2 Glasses of wine per week             Comment: SOCIALLY          ?  Drug use:  Yes              Frequency:  7.0 times per week         Types:  Marijuana         ?  Sexual activity:  Yes              Partners:  Male         Birth control/protection:  None       Social History Narrative          ** Merged History Encounter **                        Allergies        Allergen  Reactions         ?  Bee Sting [Sting, Bee]  Anaphylaxis             Not Bee Pollen         ?  Beef Containing Products  Anaphylaxis     ?  Pork Derived (Porcine)  Anaphylaxis     ?  Bee Pollen  Rash     ?  Ibuprofen  Nausea and Vomiting         ?  Nsaids (Non-Steroidal Anti-Inflammatory Drug)  Nausea Only              Current Outpatient Medications:    ?  ziprasidone (GEODON) 20 mg capsule, Take 1 Cap by mouth two (2) times daily (with meals). Indications: Bipolar I Disorder with Most Recent Episode Mixed, Disp: 180 Cap, Rfl: 1   ?  diclofenac (VOLTAREN) 1 % gel, Apply 2 g to affected area four (4) times daily., Disp: 100 g, Rfl: 3   ?  pantoprazole (PROTONIX) 40 mg tablet, Take 1 Tab by mouth daily., Disp: 90 Tab, Rfl: 1   ?  propranolol LA (INDERAL LA) 60 mg SR capsule, Take 1 Cap by mouth daily., Disp: 90 Cap, Rfl: 1        Physical Exam:         BP 119/71    Pulse 70    Temp 97.1 ??F (36.2 ??C) (Oral)    Resp 12    Ht 5\' 8"  (1.727 m)    Wt 183 lb (83 kg)    SpO2 91%    BMI 27.83 kg/m??       General:  WD, WN, NAD, conversant   Eyes: sclera clear bilaterally, no discharge noted, eyelids  normal in appearance   HENT: NCAT   Lungs: CTAB, normal respiratory effort and rate   CV: RRR, no MRGs   ABD: soft,   non-tender, non-distended, normal bowel sounds   Ext: no peripheral edema or digital cyanosis noted   Skin: normal temperature, turgor, color, and texture   Psych: alert and oriented to person, place and situation, normal affect   Neuro: speech normal, moving all extremities, gait normal           Assessment/Plan        Bipolar 1 Disorder:   -Advised on importance of seeing mental health specialist   -Will continue current Geodon regimen   -Follow-up in 2 month   ??   ??   Cardiac Arrhythmia:   -Encouraged to see cardiology for evaluation   -Will continue current propanolol regimen   -Follow-up in 2 months   ??   ??   Chronic Low Back Pain:   -Will continue current Voltaren gel regimen   -Will defer further management/evaluation to PMR   -Follow-up in 2 months            Eugenie NorrieHiram Llewyn Heap, MD   08/07/2017

## 2017-08-07 NOTE — Progress Notes (Signed)
1. Have you been to the ER, urgent care clinic since your last visit?  Hospitalized since your last visit?No    2. Have you seen or consulted any other health care providers outside of the [facility] since your last visit?  Include any pap smears or colon screening. No

## 2017-08-12 ENCOUNTER — Ambulatory Visit: Attending: Cardiovascular Disease | Primary: Family Medicine

## 2017-08-12 ENCOUNTER — Institutional Professional Consult (permissible substitution)
Admit: 2017-08-12 | Discharge: 2017-08-12 | Attending: Rehabilitative and Restorative Service Providers" | Primary: Family Medicine

## 2017-08-12 DIAGNOSIS — R102 Pelvic and perineal pain: Secondary | ICD-10-CM

## 2017-08-12 NOTE — Progress Notes (Signed)
Physical Therapy Treatment Note     Patient: Casey Koch    DOS: 08/12/2017 Visit #: 2      Subjective    Pt reports:  no significant changes  Tried to do HEP as best as possible; most days able to complete as instructed but some days only able to complete twice a day.       Objective  Diminished sensation to light touch to left L3, L4, and L5, and R L4 (foot)    Therapeutic exercise  10 min    Patient performed and was instructed in the home program for the following flexibility exercises: cobbler pose, happy baby, DKTC        EMG biofeedback/ Pelvic floor exercises  25 min (NMR)    Electrodes Used     perianal surface electrodes    Patient Position  sitting    Initial Resting Tone  2.5 mV    Reflexive Cough  7 mV    Quick Contractions  Not Tested    Sustained Contractions  5s hold/ 10s rest    Net Contractions  7mV  Resting Average  2.696mV  Max Contraction  35.8mV      Resting/ Release on EMG:  Hypertonic-elevated resting tone but good release with practice.   On visual observation, pt with perineal ascent with pelvic floor muscle contraction, delayed relaxation with no descent.      Casey Perlaicholas Burtch instructed in and performed home exercise program with a 3 second hold and a 15 second release for 10 repetitions in the seated or reclined position 3 times daily.    Pt was instructed in pelvic floor release exercises to be performed throughout the day. Pt will regularly assess pelvic floor tension and make conscious effort to allow a "release" of pelvic floor to establish more normal resting tone.     Patient instructed in role of diaphragm, pelvic floor and abdominals in pelvic floor mobility and and central nervous system relaxation. Tactile cueing at lower costals and diaphragm for appropriate technique and verbal cueing provided. Practiced focused diaphragmatic breathing with emphasis on release of lower abdominals and pelvic floor with inhale. Pt to  practice multiple times a day in various positions including supine, sit and stance.      Patient education  5 min    Pt given home program education ZO:[id number]:Progressive PME's, Pelvic muscle downtraining and "tone checks" daily, Progressive therapeutic stretching, Progressive relaxation techniques including diaphragmatic breathing, self external PFM massage, and relaxed bladder voiding       Assessment    Patient is consistent with HEP and appears motivated for improvement. Patient able to sense and coordinate PFM relaxation however unable to achieve descent and lengthening. Initiated stretches today with good response and issued to HEP. No change with lumbar dermatomes this visit but no worse with addition of stretches. Patient also instructed on external massage and will report back on his f/u visit if there is tenderness.      Plan    Patient will continue with HEP, with above treatment modifications  Consider next visit: increase PME's  Continued patient education regarding bladder habits, normal voiding patterns, dietary habits etc.  Progress therapeutic exercises as tolerated   Prep VT       Dr. Nada BoozerMcCammon is available in clinic as incident to provider.    Time In: 1540  Time Out: 1620  Total Time (min): 40  Total Timed Codes MCR/BCBS (min): 40  Total 1:1 Time (min): 40    Talbert ForestShirley  Avel Sensor, DPT, OCS  Physical Therapist  [facility]

## 2017-08-12 NOTE — Progress Notes (Signed)
 Physical Therapy Treatment Note     Patient: Casey Koch    DOS: 08/12/2017 Visit #: 2      Subjective    Pt reports:  no significant changes  Tried to do HEP as best as possible; most days able to complete as instructed but some days only able to complete twice a day.       Objective  Diminished sensation to light touch to left L3, L4, and L5, and R L4 (foot)    Therapeutic exercise  10 min    Patient performed and was instructed in the home program for the following flexibility exercises: cobbler pose, happy baby, DKTC        EMG biofeedback/ Pelvic floor exercises  25 min (NMR)    Electrodes Used     perianal surface electrodes    Patient Position  sitting    Initial Resting Tone  2.5 mV    Reflexive Cough  7 mV    Quick Contractions  Not Tested    Sustained Contractions  5s hold/ 10s rest    Net Contractions  7mV  Resting Average  2.34mV  Max Contraction  35.8mV      Resting/ Release on EMG:  Hypertonic-elevated resting tone but good release with practice.   On visual observation, pt with perineal ascent with pelvic floor muscle contraction, delayed relaxation with no descent.      Casey Koch instructed in and performed home exercise program with a 3 second hold and a 15 second release for 10 repetitions in the seated or reclined position 3 times daily.    Pt was instructed in pelvic floor release exercises to be performed throughout the day. Pt will regularly assess pelvic floor tension and make conscious effort to allow a release of pelvic floor to establish more normal resting tone.     Patient instructed in role of diaphragm, pelvic floor and abdominals in pelvic floor mobility and and central nervous system relaxation. Tactile cueing at lower costals and diaphragm for appropriate technique and verbal cueing provided. Practiced focused diaphragmatic breathing with emphasis on release of lower abdominals and pelvic floor with inhale. Pt to practice multiple times a day in various positions including  supine, sit and stance.      Patient education  5 min    Pt given home program education mz:Emnhmzddpcz PME's, Pelvic muscle downtraining and tone checks daily, Progressive therapeutic stretching, Progressive relaxation techniques including diaphragmatic breathing, self external PFM massage, and relaxed bladder voiding       Assessment    Patient is consistent with HEP and appears motivated for improvement. Patient able to sense and coordinate PFM relaxation however unable to achieve descent and lengthening. Initiated stretches today with good response and issued to HEP. No change with lumbar dermatomes this visit but no worse with addition of stretches. Patient also instructed on external massage and will report back on his f/u visit if there is tenderness.      Plan    Patient will continue with HEP, with above treatment modifications  Consider next visit: increase PME's  Continued patient education regarding bladder habits, normal voiding patterns, dietary habits etc.  Progress therapeutic exercises as tolerated   Prep VT       Dr. Genora is available in clinic as incident to provider.    Time In: 1540  Time Out: 1620  Total Time (min): 40  Total Timed Codes MCR/BCBS (min): 40  Total 1:1 Time (min): 40    Orlean  Bennet KLEIN, DPT, OCS  Physical Therapist  [facility] 

## 2017-08-17 NOTE — Telephone Encounter (Signed)
Prescription inderal 60 mg tablets  .Marland Kitchen.Marland Kitchen.Marland Kitchen.Marland Kitchen.patient has been taking the  Inderal ER 60 mg tablets.     Lisa at [phone] .... Pharmacy is inquiring if change or mishap.

## 2017-08-18 ENCOUNTER — Encounter

## 2017-08-18 ENCOUNTER — Institutional Professional Consult (permissible substitution): Admit: 2017-08-18 | Attending: Rehabilitative and Restorative Service Providers" | Primary: Family Medicine

## 2017-08-18 DIAGNOSIS — R102 Pelvic and perineal pain: Secondary | ICD-10-CM

## 2017-08-18 NOTE — Progress Notes (Signed)
Physical Therapy Treatment Note     Patient: Casey Koch    DOS: 08/18/2017 Visit #: 3      Subjective    Pt reports:  Urination has been easier; has bene practicing relaxed voiding.   Performing stretches consistently.  No change in testicle pain despite focusing on relaxation. States testicle pain is closer to the top of the testicle running upwards.   Went to the ER 1-2 days after last appointment for heat exhaustion and dehydration but this did not worsen symptoms.       Objective    Therapeutic exercise  25 min    Patient performed and was instructed in the home program for the following flexibility exercises:  kneeling thoracic extension mob with dowel and "open book" thoracic rotation in sidelying to improve spinal mobility and reduce spinal nerve compression.         Modalities    Moist heat used pre treatment to pelvic region in sitting position for 10 minutes      Patient education  5 min    Pt given home program education ZO:[id number]:Progressive therapeutic stretching   Pt educated on spine and nerve anatomy and its relevance to their symptoms.       Assessment    Patient reports improved bladder control, including improved sense of emptying  Patient is consistent with HEP and appears motivated for improvement   No change with PFM relaxation as patient does adhere to HEP compliance. Patient instructed on and performed thoracic mobility exercises today and issued to HEP to address movement dysfunction at T12-L1; given patient's history of L2-L3 fracture, patient's testicle pain may be related to dermatomal referral. Patient will f/u in 2 weeks to assess symptoms with addition of thoracic mobility. Patient was educated and instructed to discontinue if symptoms worsen.      Plan    Patient will continue with HEP, with above treatment modifications  Voiding assessment next visit        Dr. Nada BoozerMcCammon is available in clinic as incident to provider.    Time In: 1600  Time Out: 1640  Total Time (min): 40   Total Timed Codes MCR/BCBS (min): 30  Total 1:1 Time (min): 30    Corrinne EagleShirley Aric Jost PT, DPT, OCS  Physical Therapist  [facility]

## 2017-08-18 NOTE — Progress Notes (Signed)
 Physical Therapy Treatment Note     Patient: Casey Koch    DOS: 08/18/2017 Visit #: 3      Subjective    Pt reports:  Urination has been easier; has bene practicing relaxed voiding.   Performing stretches consistently.  No change in testicle pain despite focusing on relaxation. States testicle pain is closer to the top of the testicle running upwards.   Went to the ER 1-2 days after last appointment for heat exhaustion and dehydration but this did not worsen symptoms.       Objective    Therapeutic exercise  25 min    Patient performed and was instructed in the home program for the following flexibility exercises:  kneeling thoracic extension mob with dowel and open book thoracic rotation in sidelying to improve spinal mobility and reduce spinal nerve compression.         Modalities    Moist heat used pre treatment to pelvic region in sitting position for 10 minutes      Patient education  5 min    Pt given home program education mz:Emnhmzddpcz therapeutic stretching   Pt educated on spine and nerve anatomy and its relevance to their symptoms.       Assessment    Patient reports improved bladder control, including improved sense of emptying  Patient is consistent with HEP and appears motivated for improvement   No change with PFM relaxation as patient does adhere to HEP compliance. Patient instructed on and performed thoracic mobility exercises today and issued to HEP to address movement dysfunction at T12-L1; given patient's history of L2-L3 fracture, patient's testicle pain may be related to dermatomal referral. Patient will f/u in 2 weeks to assess symptoms with addition of thoracic mobility. Patient was educated and instructed to discontinue if symptoms worsen.      Plan    Patient will continue with HEP, with above treatment modifications  Voiding assessment next visit        Dr. Genora is available in clinic as incident to provider.    Time In: 1600  Time Out: 1640  Total Time (min): 40  Total Timed  Codes MCR/BCBS (min): 30  Total 1:1 Time (min): 30    Orlean Drape PT, DPT, OCS  Physical Therapist  [facility] 

## 2017-08-19 MED ORDER — PROPRANOLOL 60 MG 24 HR SUSTAINED ACTION CAP
60 mg | ORAL_CAPSULE | Freq: Every day | ORAL | 1 refills | Status: AC
Start: 2017-08-19 — End: ?

## 2017-09-01 ENCOUNTER — Institutional Professional Consult (permissible substitution)
Admit: 2017-09-01 | Discharge: 2017-09-01 | Attending: Rehabilitative and Restorative Service Providers" | Primary: Family Medicine

## 2017-09-01 DIAGNOSIS — R102 Pelvic and perineal pain: Secondary | ICD-10-CM

## 2017-09-01 NOTE — Progress Notes (Signed)
Physical Therapy Treatment Note     Patient: Casey Koch    DOS: 09/01/2017 Visit #: 4      Subjective    Pt reports:  Voiding better and is relaxing versus pushing most of the time.  States having increased testicle pain and back pain since 4 days ago; states he stretched his neck and it popped and sent pain down his entire back. States both pain has been increased since then.     Objective  Therapeutic exercise  15 min    Patient performed and was instructed in the home program for the following flexibility exercises: cat/camel and child pose 3 way. Pt educated on use of breath and rib expansion during stretching for thoracic and ribcage mobility.      Attempted prone Ts however thoracic pain provoked thus discontinued.         EMG biofeedback/ Pelvic floor exercises  15 min (NMR)      Voiding Trial  Pre void volume:    64 ml  Post void volume:  Not taken    Voiding Assessment:  voided with EMG set up utilizing perianal electrodes in restroom with visual feedback for normal motor function training., was educated regarding normal release pattern and advised to attempt to release pelvic floor/ sphincter. and voided with a normal release pattern.     Patient education  5 min    Pt given home program education RU:[id number]:Progressive therapeutic stretching and Progressive relaxation techniques including diaphragmatic breathing       Assessment    Patient  reports minimal change in pain from initiation of treatment  Patient is consistent with HEP and appears motivated for improvement   Patient with increased back pain today however aggravating factor seems to be from his neck. Focus on thoracic mobility in mid range today with good tolerance and issued to HEP to address spinal mobility. Voiding test today showed good pelvic floor release during void despite patient stating he had no urge. Possible referral for back neck and thoracic pain to be addressed if patient does not improve with his testicle pain as this  recent flair up seems related to his upper thoracic overflowing into mid to low thoracic spine to the T12-L1 dermatome.      Plan    Progress therapeutic exercises as tolerated      Dr. Nada BoozerMcCammon is available in clinic as incident to provider.    Time In: 1450  Time Out: 1530  Total Time (min): 40  Total Timed Codes MCR/BCBS (min): 30  Total 1:1 Time (min): 30    Corrinne EagleShirley Shaquera Ansley PT, DPT, OCS  Physical Therapist  [facility]

## 2017-09-01 NOTE — Progress Notes (Signed)
Physical Therapy Treatment Note     Patient: Casey Koch    DOS: 09/01/2017 Visit #: 4      Subjective    Pt reports:  Voiding better and is relaxing versus pushing most of the time.  States having increased testicle pain and back pain since 4 days ago; states he stretched his neck and it popped and sent pain down his entire back. States both pain has been increased since then.     Objective  Therapeutic exercise  15 min    Patient performed and was instructed in the home program for the following flexibility exercises: cat/camel and child pose 3 way. Pt educated on use of breath and rib expansion during stretching for thoracic and ribcage mobility.      Attempted prone Ts however thoracic pain provoked thus discontinued.         EMG biofeedback/ Pelvic floor exercises  15 min (NMR)      Voiding Trial  Pre void volume:    64 ml  Post void volume:  Not taken    Voiding Assessment:  voided with EMG set up utilizing perianal electrodes in restroom with visual feedback for normal motor function training., was educated regarding normal release pattern and advised to attempt to release pelvic floor/ sphincter. and voided with a normal release pattern.     Patient education  5 min    Pt given home program education ZO:[id number]:Progressive therapeutic stretching and Progressive relaxation techniques including diaphragmatic breathing       Assessment    Patient  reports minimal change in pain from initiation of treatment  Patient is consistent with HEP and appears motivated for improvement   Patient with increased back pain today however aggravating factor seems to be from his neck. Focus on thoracic mobility in mid range today with good tolerance and issued to HEP to address spinal mobility. Voiding test today showed good pelvic floor release during void despite patient stating he had no urge. Possible referral for back neck and thoracic pain to be addressed if patient does not improve with his testicle pain as this recent flair up  seems related to his upper thoracic overflowing into mid to low thoracic spine to the T12-L1 dermatome.      Plan    Progress therapeutic exercises as tolerated      Dr. Nada BoozerMcCammon is available in clinic as incident to provider.    Time In: 1450  Time Out: 1530  Total Time (min): 40  Total Timed Codes MCR/BCBS (min): 30  Total 1:1 Time (min): 30    [name] PT, DPT, OCS  Physical Therapist  [facility]

## 2017-09-15 ENCOUNTER — Institutional Professional Consult (permissible substitution)
Admit: 2017-09-15 | Discharge: 2017-09-15 | Attending: Rehabilitative and Restorative Service Providers" | Primary: Family Medicine

## 2017-09-15 DIAGNOSIS — R102 Pelvic and perineal pain: Secondary | ICD-10-CM

## 2017-09-15 NOTE — Progress Notes (Signed)
Physical Therapy Treatment Note     Patient: Casey Koch    DOS: 09/15/2017 Visit #: 5      Subjective    Pt reports:  Neck pain is resolving, testicle pain not present today but occurred yesterday while sitting. Has been performing PF massage but only 2x/week.       Objective  Tenderness to left external pelvic floor, reproduction of R testicle pain with down training    External Manual Therapy  30 min    trigger point release, soft tissue mobilization and myofascial release to the following areas: bilat external pelvic floor, focusing more on left, STM/MFR to suprapubic fascia  response to external manual therapy: good reduction in palpable spasm with patient reporting diminished pain        Pelvic floor exercises  10 min (NMR)    Pt verbalized consent for external pelvic floor exam and consented for use of tactile cuing to the perineum to improve pelvic floor awareness for contraction and relaxation of the urogenital triangle. Pt instructed on use of a mirror to provide visual cues or using their hand at their perineum to improve awareness and proprioception of their pelvic floor during contraction and relaxation with focus on pelvic floor lengthening during relaxation.            Modalities    Moist heat used pre and post treatment to pelvic region in supine position for 10 minutes before and after      Patient education   During MT    Pt given home program education re:Pelvic muscle downtraining and "tone checks" daily, Self trigger point release and external manual therapy methods and Use of heat and/or cold for pain management       Assessment    Good response to manual treatment with reduction of muscle tension to the external pelvic floor with production of R anterior testicle pain during pelvic floor down training; this resolved with MFR/STM to the suprapubic area most likely due to genitofemoral nerve compression. Pt with improved awareness of pelvic floor lengthening after given tactile cues and  instructed to perform external PF massage daily versus 2x/week and to continue practicing PF down training now that he has improved proprioception.      Plan    Continue manual treatment and down training      Dr. Goldin is available in clinic as incident to provider.    Time In: 1445  Time Out: 1545  Total Time (min): 60  Total Timed Codes MCR/BCBS (min): 40  Total 1:1 Time (min): 40    Dejia Ebron PT, DPT, OCS  Physical Therapist  [facility]    ;

## 2017-09-15 NOTE — Progress Notes (Signed)
 Physical Therapy Treatment Note     Patient: Casey Koch    DOS: 09/15/2017 Visit #: 5      Subjective    Pt reports:  Neck pain is resolving, testicle pain not present today but occurred yesterday while sitting. Has been performing PF massage but only 2x/week.       Objective  Tenderness to left external pelvic floor, reproduction of R testicle pain with down training    External Manual Therapy  30 min    trigger point release, soft tissue mobilization and myofascial release to the following areas: bilat external pelvic floor, focusing more on left, STM/MFR to suprapubic fascia  response to external manual therapy: good reduction in palpable spasm with patient reporting diminished pain        Pelvic floor exercises  10 min (NMR)    Pt verbalized consent for external pelvic floor exam and consented for use of tactile cuing to the perineum to improve pelvic floor awareness for contraction and relaxation of the urogenital triangle. Pt instructed on use of a mirror to provide visual cues or using their hand at their perineum to improve awareness and proprioception of their pelvic floor during contraction and relaxation with focus on pelvic floor lengthening during relaxation.            Modalities    Moist heat used pre and post treatment to pelvic region in supine position for 10 minutes before and after      Patient education   During MT    Pt given home program education mz:Ezocpr muscle downtraining and tone checks daily, Self trigger point release and external manual therapy methods and Use of heat and/or cold for pain management       Assessment    Good response to manual treatment with reduction of muscle tension to the external pelvic floor with production of R anterior testicle pain during pelvic floor down training; this resolved with MFR/STM to the suprapubic area most likely due to genitofemoral nerve compression. Pt with improved awareness of pelvic floor lengthening after given tactile cues and  instructed to perform external PF massage daily versus 2x/week and to continue practicing PF down training now that he has improved proprioception.      Plan    Continue manual treatment and down training      Dr. Goldin is available in clinic as incident to provider.    Time In: 1445  Time Out: 1545  Total Time (min): 60  Total Timed Codes MCR/BCBS (min): 40  Total 1:1 Time (min): 40    Orlean Drape PT, DPT, OCS  Physical Therapist  [facility]     ;

## 2017-09-22 ENCOUNTER — Institutional Professional Consult (permissible substitution)
Admit: 2017-09-22 | Discharge: 2017-09-22 | Attending: Rehabilitative and Restorative Service Providers" | Primary: Family Medicine

## 2017-09-22 DIAGNOSIS — R102 Pelvic and perineal pain: Secondary | ICD-10-CM

## 2017-09-22 NOTE — Progress Notes (Signed)
Physical Therapy Treatment Note     Patient: Casey Koch    DOS: 09/24/2017 Visit #: 6      Subjective    Pt reports:  no significant changes  Having left testicle pain while sitting still. Intermittent R testicle pain is present but not currently. Tried to increase massaging his pelvic floor but it seems to be more sore and he thinks he is being too aggressive but not sure how much pressure he should be putting; would like to review this again today.       Objective  L UPA L1, L2 produces pain on L  R UPA L1 and L2 produces right testicle pain  L closing mobilization reduces L back pain, NE on R testicle  R closing mobilization has no effect on R testicle  R gapping of thoracolumbar area reduces R testicle pain, reduces L back pain    External Manual Therapy  40 min includes objective measures    soft tissue mobilization and myofascial release to the following areas: left perineum/external pelvic floor  response to external manual therapy: good reduction in palpable spasm with patient reporting diminished pain  Right long axis distraction in left sidelying with pillows facilitating left side bend  Response: reduction of right testicle pain    Therapeutic exercise  10 min    Patient performed and was instructed in the home program for the following flexibility exercises: right spinal gapping at EOB  Reviewed pelvic floor lengthening/relaxation with tactile cues.    Patient education  5 min    Pt given home program education NU:[id number]:Progressive therapeutic stretching and reviewed self external pelvic floor massage       Assessment    Pt presents R testicle pain provoked by R thocolumbar spinal restrictions and left testicle pain due to pelvic floor muscle tension. Pt with good reduction of pain after manual treatment to both regions. Pt edu again on external pelvic floor massage and to use gentle pressure; pt most likely too aggressive with self massage to the area and aggravating the tissue.  Pt would benefit from further manual treatment to resolve pain.     Pt will continue to benefit from skilled PT to improve pelvic floor muscle control and coordination to assist with improvement in urinary symptoms.       Plan    MFR to thorax and ribcage  External PF massage PRN    Dr. Maple Koch is available in clinic as incident to provider.    Time In: 1620  Time Out: 1715  Total Time (min): 55  Total Timed Codes MCR/BCBS (min): 55  Total 1:1 Time (min): 55    Casey Koch PT, DPT, OCS  Physical Therapist  [facility]

## 2017-09-22 NOTE — Progress Notes (Signed)
Physical Therapy Treatment Note     Patient: Casey Koch    DOS: 09/24/2017 Visit #: 6      Subjective    Pt reports:  no significant changes  Having left testicle pain while sitting still. Intermittent R testicle pain is present but not currently. Tried to increase massaging his pelvic floor but it seems to be more sore and he thinks he is being too aggressive but not sure how much pressure he should be putting; would like to review this again today.       Objective  L UPA L1, L2 produces pain on L  R UPA L1 and L2 produces right testicle pain  L closing mobilization reduces L back pain, NE on R testicle  R closing mobilization has no effect on R testicle  R gapping of thoracolumbar area reduces R testicle pain, reduces L back pain    External Manual Therapy  40 min includes objective measures    soft tissue mobilization and myofascial release to the following areas: left perineum/external pelvic floor  response to external manual therapy: good reduction in palpable spasm with patient reporting diminished pain  Right long axis distraction in left sidelying with pillows facilitating left side bend  Response: reduction of right testicle pain    Therapeutic exercise  10 min    Patient performed and was instructed in the home program for the following flexibility exercises: right spinal gapping at EOB  Reviewed pelvic floor lengthening/relaxation with tactile cues.    Patient education  5 min    Pt given home program education [id number]:Progressive therapeutic stretching and reviewed self external pelvic floor massage       Assessment    Pt presents R testicle pain provoked by R thocolumbar spinal restrictions and left testicle pain due to pelvic floor muscle tension. Pt with good reduction of pain after manual treatment to both regions. Pt edu again on external pelvic floor massage and to use gentle pressure; pt most likely too aggressive with self massage to the area and aggravating the tissue. Pt would benefit from  further manual treatment to resolve pain.     Pt will continue to benefit from skilled PT to improve pelvic floor muscle control and coordination to assist with improvement in urinary symptoms.       Plan    MFR to thorax and ribcage  External PF massage PRN    Dr. Maple HudsonYoung is available in clinic as incident to provider.    Time In: 1620  Time Out: 1715  Total Time (min): 55  Total Timed Codes MCR/BCBS (min): 55  Total 1:1 Time (min): 55    [name] PT, DPT, OCS  Physical Therapist  [facility]

## 2017-09-30 ENCOUNTER — Institutional Professional Consult (permissible substitution)
Admit: 2017-09-30 | Discharge: 2017-09-30 | Attending: Rehabilitative and Restorative Service Providers" | Primary: Family Medicine

## 2017-09-30 DIAGNOSIS — R102 Pelvic and perineal pain: Secondary | ICD-10-CM

## 2017-09-30 NOTE — Progress Notes (Signed)
Physical Therapy Treatment Note     Patient: Casey Koch    DOS: 09/30/2017 Visit #: 7      Subjective    Pt reports:  States that he tried to stretch in bed but it was softer than he thought and he fell out of bed. States he did not feel like he hurt himself. Has been massaging more and is more gentle with his pressure; thinks that overall tenderness has decreased.       Objective  Improved superficial soft tissue mobility compared to last visit to external pelvic floor.     External Manual Therapy  35 min    soft tissue mobilization and myofascial release to the following areas: left external pelvic floor; skin rolling to spinal column and thoracolumbar fascia to the ribs  response to external manual therapy: good reduction in palpable spasm with patient reporting diminished pain; improved fascial mobility along thoracolumbar area    Therapeutic exercise  5 min    Patient performed and was instructed in the home program for the following flexibility exercises: HS 3 way        Pelvic floor exercises  15 min (NMR)    Tactile cues given for contraction followed by focused relaxation and lengthening. Pt then instructed on self palpation to the external pelvic floor to improve motor sensory awareness.     Keyler Grigsby instructed in and performed home exercise program with a 2 second hold and a 20 second release for 4 repetitions in the any position 4-5 times daily.    Patient education  With TE    Pt given home program education [id number] muscle downtraining daily and Progressive therapeutic stretching       Assessment    Pt with good response to manual treatment today with overall improved tissue mobility noted to the external pelvic floor. Palpable tenderness noted with deeper palpation but this should improve as pt continues to perform self massage at home as well as active DT to improve pelvic floor coordination and lengthening. Good tolerance to additional stretches added today; issued to HEP.      Pt will continue to benefit from skilled PT to improve pelvic floor muscle control and coordination to assist with improvement in pelvic pain.       Plan    Patient will continue with HEP, with above treatment modifications  Consider next visit: increase PME's      Dr. Verdie Mosher is available in clinic as incident to provider.    Time In: 1605  Time Out: 1700  Total Time (min): 55  Total Timed Codes MCR/BCBS (min): 55  Total 1:1 Time (min): 55    Corrinne Eagle PT, DPT, OCS  Physical Therapist  [facility]

## 2017-09-30 NOTE — Progress Notes (Signed)
Physical Therapy Treatment Note     Patient: Casey Koch    DOS: 09/30/2017 Visit #: 7      Subjective    Pt reports:  States that he tried to stretch in bed but it was softer than he thought and he fell out of bed. States he did not feel like he hurt himself. Has been massaging more and is more gentle with his pressure; thinks that overall tenderness has decreased.       Objective  Improved superficial soft tissue mobility compared to last visit to external pelvic floor.     External Manual Therapy  35 min    soft tissue mobilization and myofascial release to the following areas: left external pelvic floor; skin rolling to spinal column and thoracolumbar fascia to the ribs  response to external manual therapy: good reduction in palpable spasm with patient reporting diminished pain; improved fascial mobility along thoracolumbar area    Therapeutic exercise  5 min    Patient performed and was instructed in the home program for the following flexibility exercises: HS 3 way        Pelvic floor exercises  15 min (NMR)    Tactile cues given for contraction followed by focused relaxation and lengthening. Pt then instructed on self palpation to the external pelvic floor to improve motor sensory awareness.     Casey Koch instructed in and performed home exercise program with a 2 second hold and a 20 second release for 4 repetitions in the any position 4-5 times daily.    Patient education  With TE    Pt given home program education [id number] muscle downtraining daily and Progressive therapeutic stretching       Assessment    Pt with good response to manual treatment today with overall improved tissue mobility noted to the external pelvic floor. Palpable tenderness noted with deeper palpation but this should improve as pt continues to perform self massage at home as well as active DT to improve pelvic floor coordination and lengthening. Good tolerance to additional stretches added today; issued to HEP.     Pt will  continue to benefit from skilled PT to improve pelvic floor muscle control and coordination to assist with improvement in pelvic pain.       Plan    Patient will continue with HEP, with above treatment modifications  Consider next visit: increase PME's      Dr. Verdie Mosher is available in clinic as incident to provider.    Time In: 1605  Time Out: 1700  Total Time (min): 55  Total Timed Codes MCR/BCBS (min): 55  Total 1:1 Time (min): 55    Corrinne Eagle PT, DPT, OCS  Physical Therapist  [facility]

## 2017-10-20 ENCOUNTER — Ambulatory Visit: Admit: 2017-10-20 | Discharge: 2017-10-20 | Attending: Urology | Primary: Family Medicine

## 2017-10-20 ENCOUNTER — Ambulatory Visit
Admit: 2017-10-20 | Discharge: 2017-10-20 | Payer: PRIVATE HEALTH INSURANCE | Attending: Family Medicine | Primary: Family Medicine

## 2017-10-20 ENCOUNTER — Ambulatory Visit: Attending: Urology | Primary: Family Medicine

## 2017-10-20 ENCOUNTER — Ambulatory Visit: Attending: Family Medicine | Primary: Family Medicine

## 2017-10-20 DIAGNOSIS — Z23 Encounter for immunization: Secondary | ICD-10-CM

## 2017-10-20 DIAGNOSIS — N50819 Testicular pain, unspecified: Secondary | ICD-10-CM

## 2017-10-20 LAB — AMB POC URINALYSIS DIP STICK AUTO W/O MICRO
Bilirubin (UA POC): NEGATIVE
Bilirubin, Urine, POC: NEGATIVE
Blood (UA POC): NEGATIVE
Blood (UA POC): NEGATIVE
Glucose (UA POC): NEGATIVE
Glucose, Urine, POC: NEGATIVE
Ketones (UA POC): NEGATIVE
Ketones, Urine, POC: NEGATIVE
Leukocyte Esterase, Urine, POC: NEGATIVE
Leukocyte esterase (UA POC): NEGATIVE
Nitrite, Urine, POC: NEGATIVE
Nitrites (UA POC): NEGATIVE
Protein (UA POC): NEGATIVE
Protein, Urine, POC: NEGATIVE
Specific Gravity, Urine, POC: 1.01 NA (ref 1.001–1.035)
Specific gravity (UA POC): 1.01 (ref 1.001–1.035)
Urobilinogen (UA POC): 0.2 (ref 0.2–1)
Urobilinogen, POC: 0.2 (ref 0.2–1)
pH (UA POC): 7 (ref 4.6–8.0)
pH, Urine, POC: 7 NA (ref 4.6–8.0)

## 2017-10-20 LAB — AMB POC PVR, MEAS,POST-VOID RES,US,NON-IMAGING
PVR POC: 1 cc
PVR: 1 cc

## 2017-10-20 MED ORDER — DICLOFENAC 75 MG TAB, DELAYED RELEASE
75 mg | ORAL_TABLET | Freq: Two times a day (BID) | ORAL | 2 refills | Status: AC
Start: 2017-10-20 — End: ?

## 2017-10-20 NOTE — Progress Notes (Addendum)
Casey Koch    CC: Follow-up for chronic disease management    HPI:     Bipolar I:  -Taking Geodon as prescribed  -Denies any side effects or issues with the medication   -Medication no longer seems to be working well  -Has been having a lot of stress due to relationship issues  -States Casey Koch is having bad mood swings  -Has EOC appointment tomorrow with psychiatrist  -Started seeing a therapist two weeks ago and will be seeing the therapist on a weekly basis      Chronic Low Back Pain:  -Casey Koch is to be in adequate using Voltaren as prescribed  -Denies any side effects or issues with medication  -Medication helps with pain, but at times seems to be inadequate   -Has been doing physical therapy for issue  -Has not made appointment with PMR  -States Casey Koch depression has been affecting Casey Koch motivation, which has led to him not scheduling appointments      Cardiac Arrhythmia:  -Taking propanolol as prescribed  -Denies any side effects or issues with the medication  -Reports medication seems to help control Casey Koch HR  -Patient reports Casey Koch was not aware that Casey Koch missed Casey Koch appointment with the cardiologist  -States Casey Koch depression has been affecting Casey Koch motivation, which has led to him not scheduling appointments      ROS: Positive items marked in RED  CON: fever, chills  Cardiovascular: palpitations, CP  Resp: SOB, cough  GI: nausea, vomiting, diarrhea  GU: dysuria, hematuria      Past Medical History:   Diagnosis Date   ??? Barrett esophagus    ??? Bipolar 1 disorder (HCC)    ??? Crohn's disease (HCC)    ??? Dysfunctional voiding of urine    ??? History of myocardial infarction 2018   ??? Orchalgia    ??? PTSD (post-traumatic stress disorder)    ??? Sleep apnea    ??? SVT (supraventricular tachycardia) (HCC)        Past Surgical History:   Procedure Laterality Date   ??? HX SVT ABLATION  2017   ??? HX SVT ABLATION         Family History   Problem Relation Age of Onset   ??? Psychiatric Disorder Mother    ??? Alcohol abuse Mother     ??? Diabetes Father    ??? Asthma Father    ??? Migraines Father    ??? No Known Problems Brother        Social History     Socioeconomic History   ??? Marital status: MARRIED     Spouse name: Not on file   ??? Number of children: Not on file   ??? Years of education: Not on file   ??? Highest education level: Not on file   Tobacco Use   ??? Smoking status: Never Smoker   ??? Smokeless tobacco: Never Used   Substance and Sexual Activity   ??? Alcohol use: Yes     Alcohol/week: 2.0 standard drinks     Types: 2 Glasses of wine per week     Comment: SOCIALLY    ??? Drug use: Yes     Frequency: 7.0 times per week     Types: Marijuana   ??? Sexual activity: Yes     Partners: Male     Birth control/protection: None   Social History Narrative    ** Merged History Encounter **            Allergies  Allergen Reactions   ??? Bee Sting [Sting, Bee] Anaphylaxis     Not Bee Pollen   ??? Beef Containing Products Anaphylaxis   ??? Pork Derived (Porcine) Anaphylaxis   ??? Bee Pollen Rash   ??? Ibuprofen Nausea and Vomiting   ??? Nsaids (Non-Steroidal Anti-Inflammatory Drug) Nausea Only         Current Outpatient Medications:   ???  propranolol LA (INDERAL LA) 60 mg SR capsule, Take 1 Cap by mouth daily., Disp: 90 Cap, Rfl: 1  ???  ziprasidone (GEODON) 20 mg capsule, Take 1 Cap by mouth two (2) times daily (with meals). Indications: Bipolar I Disorder with Most Recent Episode Mixed, Disp: 180 Cap, Rfl: 1  ???  diclofenac (VOLTAREN) 1 % gel, Apply 2 g to affected area four (4) times daily., Disp: 100 g, Rfl: 3  ???  pantoprazole (PROTONIX) 40 mg tablet, Take 1 Tab by mouth daily., Disp: 90 Tab, Rfl: 1    Physical Exam:      BP 115/72 (BP 1 Location: Left arm, BP Patient Position: Sitting)    Pulse 80    Temp 98.7 ??F (37.1 ??C) (Oral)    Resp 16    Ht 5\' 8"  (1.727 m)    Wt 175 lb 6.4 oz (79.6 kg)    SpO2 98%    BMI 26.67 kg/m??     General:  WD, WN, NAD, conversant  Eyes: sclera clear bilaterally, no discharge noted, eyelids normal in appearance  HENT: NCAT   Lungs: CTAB, normal respiratory effort and rate  CV: RRR, no MRGs  ABD: soft, no guarding, non-distended, normal bowel sounds  Skin: normal temperature, turgor, color, and texture  Psych: alert and oriented to person, place and situation, normal affect  Neuro: speech normal, moving all extremities, gait normal      Assessment/Plan     Bipolar 1??Disorder:  -Will continue current Geodon regimen  -Plan to defer management to psychiatry  -Follow-up in 1 month for well man visit  ??  ??  Cardiac??Arrhythmia:  -Will continue current propanolol regimen  -Encouraged to see cardiology for evaluation  -Follow-up in 1 month for well man visit  ??  ??  Chronic??Low??Back??Pain:  -Will start on oral diclofenac regimen  -Encouraged to see PMR for evaluation  -Follow-up in 1 month for well man visit      Health Maintenance:  -Flu shot given today        Eugenie Norrie, MD  10/20/2017, 3:08 PM

## 2017-10-20 NOTE — Progress Notes (Signed)
10/20/2017    Casey Koch  05/08/1990       ASSESSMENT:     ICD-10-CM ICD-9-CM    1. Orchalgia N50.819 608.9    2. Dysfunctional voiding of urine N39.8 599.9 AMB POC PVR, MEAS,POST-VOID RES,US,NON-IMAGING      AMB POC URINALYSIS DIP STICK AUTO W/O MICRO            PLAN:    Pt doing better with less pain and improved voiding with PFPT efforts.  Pleased with status.   Continue HEP and in-office sessions.  Next visit in early October.   Follow up if symptoms worsen or fail to improve.         Chief Complaint   Patient presents with   ??? Testicle Pain     Pt presents for f/u to pain and PT. He stated that the painis better.       HISTORY OF PRESENT ILLNESS:  Casey Koch is a 27 y.o. Caucasian male who presents today in follow up of testicular pain, now s/p 7 sessions of PFPT.  At last visit, patient had a prior US that showed bilateral epididymal cysts, but no findings to explain his chronic testicular pain. 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  Those sessions have been beneficial for patient - he has noted decreased pain and improved voiding.  Working on home exercises with intermittent office visits.  Pleased with current status.  Not on any GU meds.   Denies UTIs, no hemturia, dysuria or flank pain.   Denies complaint.     ??    REVIEW OF SYSTEMS:  Constitutional: Fever: No  Skin: Rash: No  HEENT: Hearing difficulty: No  Eyes: Blurred vision: No  Cardiovascular: Chest pain: No  Respiratory: Shortness of breath: No  Gastrointestinal: Nausea/vomiting: No  Musculoskeletal: Back pain: No  Neurological: Weakness: No  Psychological: Memory loss: No  Comments/additional findings:        Past Medical History:   Diagnosis Date   ??? Barrett esophagus    ??? Bipolar 1 disorder (HCC)    ??? Crohn's disease (HCC)    ??? Dysfunctional voiding of urine    ??? History of myocardial infarction 2018   ??? Orchalgia    ??? PTSD (post-traumatic stress disorder)     ??? Sleep apnea    ??? SVT (supraventricular tachycardia) (HCC)        Past Surgical History:   Procedure Laterality Date   ??? HX SVT ABLATION  2017   ??? HX SVT ABLATION         Social History     Tobacco Use   ??? Smoking status: Never Smoker   ??? Smokeless tobacco: Never Used   Substance Use Topics   ??? Alcohol use: Yes     Alcohol/week: 2.0 standard drinks     Types: 2 Glasses of wine per week     Comment: SOCIALLY    ??? Drug use: Yes     Frequency: 7.0 times per week     Types: Marijuana       Allergies   Allergen Reactions   ??? Bee Sting [Sting, Bee] Anaphylaxis     Not Bee Pollen   ??? Beef Containing Products Anaphylaxis   ??? Pork Derived (Porcine) Anaphylaxis   ??? Bee Pollen Rash   ??? Ibuprofen Nausea and Vomiting   ??? Nsaids (Non-Steroidal Anti-Inflammatory Drug) Nausea Only  Family History   Problem Relation Age of Onset   ??? Psychiatric Disorder Mother    ??? Alcohol abuse Mother    ??? Diabetes Father    ??? Asthma Father    ??? Migraines Father    ??? No Known Problems Brother        Current Outpatient Medications   Medication Sig Dispense Refill   ??? propranolol LA (INDERAL LA) 60 mg SR capsule Take 1 Cap by mouth daily. 90 Cap 1   ??? ziprasidone (GEODON) 20 mg capsule Take 1 Cap by mouth two (2) times daily (with meals). Indications: Bipolar I Disorder with Most Recent Episode Mixed 180 Cap 1   ??? diclofenac (VOLTAREN) 1 % gel Apply 2 g to affected area four (4) times daily. 100 g 3   ??? pantoprazole (PROTONIX) 40 mg tablet Take 1 Tab by mouth daily. 90 Tab 1         PHYSICAL EXAMINATION:   Visit Vitals  BP 118/80   Ht 5\' 8"  (1.727 m)   Wt 183 lb (83 kg)   BMI 27.83 kg/m??     Constitutional: WDWN, Pleasant and appropriate affect, No acute distress.    CV:  No peripheral swelling noted  Respiratory: No respiratory distress or difficulties  Skin: No jaundice.    Neuro/Psych:  Alert and oriented x 3, affect appropriate.       REVIEW OF LABS AND IMAGING:    Scrotal US 06/11/17  EXAM: TESTICULAR ULTRASOUND   CLINICAL INDICATION/HISTORY: PAIN  ????> Additional: None  COMPARISON: None  _______________  FINDINGS:  RIGHT:   ????> Testicle: 3.8 x 3.0 x 1.6 cm. Normal echogenicity. No mass. Color and spectral Doppler demonstrate normal flow to the right testicle with no evidence of torsion. Arterial and venous waveforms are normal.  ????> Epididymis: There is a 4 mm epididymal head cyst.   ????> Hydrocele: None.   ????> Varicocele: None.  LEFT:   ????> Testicle: 4.0 x 3.1 x 1.7 cm. Normal echogenicity. No mass. Color and spectral Doppler demonstrate normal flow to the left testicle with no evidence of torsion. Arterial and venous waveforms are normal.  ????> Epididymis: There is a 4 mm epididymal head cyst.  ????> Hydrocele: None.   ????> Varicocele: None.  OTHER:   ????> No extratesticular scrotal mass.   ????> No scrotal wall edema.  ______________  IMPRESSION  Bilateral epididymal head cysts. No other abnormality.            Berenda Morale, PA     I have independently seen and examined the aformentioned patient. I agree with the history, physical, assesment and plan as documented above. Futheremore, I have addended the history, physical, assesment and plan above as needed in order to reflect my additional findings.   Jamey Reas, MD

## 2017-10-20 NOTE — Progress Notes (Signed)
1. Have you been to the ER, urgent care clinic or hospitalized since your last visit? YES. ER for heat exposure    2. Have you seen or consulted any other health care providers outside of the [facility] since your last visit (Include any pap smears or colon screening)? Yes, started seeing a therapist. Appt with psychiatry tomorrow.      Do you have an Advanced Directive? NO    Would you like information on Advanced Directives? NO  Brandol Schlee is a 27 y.o. male who presents for routine immunizations.   He denies any symptoms , reactions or allergies that would exclude them from being immunized today.  Risks and adverse reactions were discussed and the VIS was given to them. All questions were addressed.  He was observed for 10 min post injection. There were no reactions observed.    Adilenne Ashworth H Tatanisha Cuthbert, LPN

## 2017-10-20 NOTE — Progress Notes (Signed)
Progress Notes by Jamey ReasMcCammon, Ivorie Uplinger A, MD at 10/20/17 1315                Author: Jamey ReasMcCammon, Justina Bertini A, MD  Service: --  Author Type: Physician       Filed: 11/02/17 1013  Encounter Date: 10/20/2017  Status: Signed          Editor: Jamey ReasMcCammon, Toby Breithaupt A, MD (Physician)                    10/20/2017      Casey PerlaNicholas Buras   07-28-1990           ASSESSMENT:              ICD-10-CM  ICD-9-CM             1.  Orchalgia  N50.819  608.9       2.  Dysfunctional voiding of urine  N39.8  599.9  AMB POC PVR, MEAS,POST-VOID RES,US,NON-IMAGING                AMB POC URINALYSIS DIP STICK AUTO W/O MICRO                  PLAN:     Pt doing better with less pain and improved voiding with PFPT efforts.  Pleased with status.    Continue HEP and in-office sessions.  Next visit in early October.    Follow up if symptoms worsen or fail to improve.               Chief Complaint       Patient presents with        ?  Testicle Pain             Pt presents for f/u to pain and PT. He stated that the painis better.           HISTORY OF PRESENT ILLNESS:  Casey Koch  is a 27 y.o. Caucasian male who  presents today in follow up of testicular pain, now s/p 7 sessions of PFPT.  At last visit, patient had a prior US that showed bilateral epididymal cysts, but no findings to explain his chronic testicular pain. 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  Those sessions have been beneficial for patient - he has noted decreased pain and improved voiding.  Working on home exercises with intermittent office visits.  Pleased with  current status.  Not on any GU meds.    Denies UTIs, no hemturia, dysuria or flank pain.    Denies complaint.     ??      REVIEW OF SYSTEMS:   Constitutional: Fever: No   Skin: Rash: No   HEENT: Hearing difficulty: No   Eyes: Blurred vision: No   Cardiovascular: Chest pain: No   Respiratory: Shortness of breath: No   Gastrointestinal: Nausea/vomiting: No    Musculoskeletal: Back pain: No   Neurological: Weakness: No   Psychological: Memory loss: No   Comments/additional findings:             Past Medical History:        Diagnosis  Date         ?  Barrett esophagus       ?  Bipolar 1 disorder (HCC)       ?  Crohn's disease (HCC)       ?  Dysfunctional voiding of urine       ?  History of myocardial infarction  2018     ?  Orchalgia       ?  PTSD (post-traumatic stress disorder)       ?  Sleep apnea           ?  SVT (supraventricular tachycardia) (HCC)               Past Surgical History:         Procedure  Laterality  Date          ?  HX SVT ABLATION    2017          ?  HX SVT ABLATION                 Social History          Tobacco Use         ?  Smoking status:  Never Smoker     ?  Smokeless tobacco:  Never Used       Substance Use Topics         ?  Alcohol use:  Yes              Alcohol/week:  2.0 standard drinks         Types:  2 Glasses of wine per week             Comment: SOCIALLY          ?  Drug use:  Yes              Frequency:  7.0 times per week              Types:  Marijuana             Allergies        Allergen  Reactions         ?  Bee Sting [Sting, Bee]  Anaphylaxis             Not Bee Pollen         ?  Beef Containing Products  Anaphylaxis     ?  Pork Derived (Porcine)  Anaphylaxis     ?  Bee Pollen  Rash     ?  Ibuprofen  Nausea and Vomiting         ?  Nsaids (Non-Steroidal Anti-Inflammatory Drug)  Nausea Only             Family History         Problem  Relation  Age of Onset          ?  Psychiatric Disorder  Mother       ?  Alcohol abuse  Mother       ?  Diabetes  Father       ?  Asthma  Father       ?  Migraines  Father            ?  No Known Problems  Brother               Current Outpatient Medications          Medication  Sig  Dispense  Refill           ?  propranolol LA (INDERAL LA) 60 mg SR capsule  Take 1 Cap by mouth daily.  90 Cap  1     ?  ziprasidone (GEODON) 20 mg capsule  Take 1 Cap by mouth two (2) times daily (  with meals). Indications:  Bipolar I Disorder with Most Recent Episode Mixed  180 Cap  1     ?  diclofenac (VOLTAREN) 1 % gel  Apply 2 g to affected area four (4) times daily.  100 g  3           ?  pantoprazole (PROTONIX) 40 mg tablet  Take 1 Tab by mouth daily.  90 Tab  1              PHYSICAL EXAMINATION:    Visit Vitals      BP  118/80     Ht  5\' 8"  (1.727 m)     Wt  183 lb (83 kg)        BMI  27.83 kg/m??        Constitutional: WDWN, Pleasant and appropriate affect, No acute distress .     CV:  No peripheral swelling noted   Respiratory: No respiratory distress or difficulties   Skin: No jaundice.     Neuro/Psych:  Alert and o riented x 3, affect appropriate.          REVIEW OF LABS AND IMAGING:     Scrotal US 06/11/17   EXAM: TESTICULAR ULTRASOUND  CLINICAL INDICATION/HISTORY: PAIN  ????> Additional: None  COMPARISON: None  _______________  FINDINGS:  RIGHT:   ????> Testicle: 3.8  x 3.0 x 1.6 cm. Normal echogenicity. No mass. Color and spectral Doppler demonstrate normal flow to the right testicle with no evidence of torsion. Arterial and venous waveforms are normal.  ????> Epididymis: There is a 4 mm epididymal head cyst.    ????> Hydrocele: None.   ????> Varicocele: None.  LEFT:   ????> Testicle: 4.0 x 3.1 x 1.7 cm. Normal echogenicity. No mass. Color and spectral Doppler demonstrate normal flow to the left testicle with no evidence of torsion. Arterial  and venous waveforms are normal.  ????> Epididymis: There is a 4 mm epididymal head cyst.  ????> Hydrocele: None.   ????> Varicocele: None.  OTHER:   ????> No extratesticular scrotal mass.   ????> No scrotal  wall edema.  ______________  IMPRESSION  Bilateral epididymal head cysts. No other abnormality.                  Berenda Morale, PA       I have independently seen and examined the aformentioned patient. I agree with the history, physical, assesment and plan as documented above. Futheremore, I have addended the history,  physical, assesment and plan above as needed in order to reflect my additional  findings.    Jamey Reas, MD

## 2017-10-20 NOTE — Progress Notes (Signed)
Progress  Notes by Eugenie Norrie at 10/20/17 1430                Author: Eugenie Norrie  Service: --  Author Type: Physician       Filed: 10/21/17 0008  Encounter Date: 10/20/2017  Status: Addendum          Editor: Eugenie Norrie          Related Notes: Original Note by Eugenie Norrie filed at 10/20/17 2332                    [facility]      CC: Follow-up for chronic disease management        HPI:        Bipolar I:   -Taking Geodon as prescribed   -Denies any side effects or issues with the medication    -Medication no longer seems to be working well   -Has been having a lot of stress due to relationship issues   -States he is having bad mood swings   -Has EOC appointment tomorrow with psychiatrist   -Started seeing a therapist two weeks ago and will be seeing the therapist on a weekly basis         Chronic Low Back Pain:   -She is to be in adequate using Voltaren as prescribed   -Denies any side effects or issues with medication   -Medication helps with pain, but at times seems to be inadequate    -Has been doing physical therapy for issue   -Has not made appointment with PMR   -States his depression has been affecting his motivation, which has led to him not scheduling appointments         Cardiac Arrhythmia:   -Taking propanolol as prescribed   -Denies any side effects or issues with the medication   -Reports medication seems to help control his HR   -Patient reports he was not aware that he missed his appointment with the cardiologist   -States his depression has been affecting his motivation, which has led to him not scheduling appointments        ROS: Positive  items marked in RED   CON: fever, chills   Cardiovascular: palpitations, CP   Resp: SOB, cough   GI: nausea, vomiting, diarrhea   GU: dysuria, hematuria           Past Medical History:        Diagnosis  Date         ?  Barrett esophagus       ?  Bipolar 1 disorder (HCC)       ?  Crohn's disease (HCC)       ?  Dysfunctional  voiding of urine       ?  History of myocardial infarction  2018     ?  Orchalgia       ?  PTSD (post-traumatic stress disorder)       ?  Sleep apnea           ?  SVT (supraventricular tachycardia) (HCC)               Past Surgical History:         Procedure  Laterality  Date          ?  HX SVT ABLATION    2017          ?  HX SVT ABLATION  Family History         Problem  Relation  Age of Onset          ?  Psychiatric Disorder  Mother       ?  Alcohol abuse  Mother       ?  Diabetes  Father       ?  Asthma  Father       ?  Migraines  Father            ?  No Known Problems  Brother               Social History          Socioeconomic History         ?  Marital status:  MARRIED              Spouse name:  Not on file         ?  Number of children:  Not on file     ?  Years of education:  Not on file     ?  Highest education level:  Not on file       Tobacco Use         ?  Smoking status:  Never Smoker     ?  Smokeless tobacco:  Never Used       Substance and Sexual Activity         ?  Alcohol use:  Yes              Alcohol/week:  2.0 standard drinks         Types:  2 Glasses of wine per week             Comment: SOCIALLY          ?  Drug use:  Yes              Frequency:  7.0 times per week         Types:  Marijuana         ?  Sexual activity:  Yes              Partners:  Male         Birth control/protection:  None       Social History Narrative          ** Merged History Encounter **                        Allergies        Allergen  Reactions         ?  Bee Sting [Sting, Bee]  Anaphylaxis             Not Bee Pollen         ?  Beef Containing Products  Anaphylaxis         ?  Pork Derived (Porcine)  Anaphylaxis     ?  Bee Pollen  Rash     ?  Ibuprofen  Nausea and Vomiting         ?  Nsaids (Non-Steroidal Anti-Inflammatory Drug)  Nausea Only              Current Outpatient Medications:    ?  propranolol LA (INDERAL LA) 60 mg SR capsule, Take 1 Cap by mouth daily., Disp: 90 Cap, Rfl: 1   ?  ziprasidone  (GEODON) 20 mg capsule, Take 1 Cap  by mouth two (2) times daily (with meals). Indications: Bipolar I Disorder with Most Recent Episode Mixed, Disp: 180 Cap, Rfl: 1   ?  diclofenac (VOLTAREN) 1 % gel, Apply 2 g to affected area four (4) times daily., Disp: 100 g, Rfl: 3   ?  pantoprazole (PROTONIX) 40 mg tablet, Take 1 Tab by mouth daily., Disp: 90 Tab, Rfl: 1        Physical Exam:         BP 115/72 (BP 1 Location: Left arm, BP Patient Position: Sitting)    Pulse 80    Temp 98.7 ??F (37.1 ??C) (Oral)    Resp 16    Ht 5\' 8"  (1.727 m)    Wt 175 lb 6.4 oz (79.6 kg)    SpO2 98%     BMI 26.67 kg/m??       General:  WD, WN, NAD, conversant   Eyes: sclera clear bilaterally, no discharge noted, eyelids normal in appearance   HENT: NCA T   Lungs: CTAB, normal respiratory effort and rate   CV: RRR, no MRGs   ABD: soft,  no guarding, non-distended, normal bowel sounds   Skin: normal temperature, turgor, color, and texture   Psych: alert and oriented to person, place and situation, normal affect   Neuro: speech normal, moving all extremities, gait normal           Assessment/Plan        Bipolar 1??Disorder:   -Will continue current Geodon regimen   -Plan to defer management to psychiatry   -Follow-up in 1 month for well man visit   ??   ??   Cardiac??Arrhythmia:   -Will continue current propanolol regimen   -Encouraged to see cardiology for evaluation   -Follow-up in 1 month for well man visit   ??   ??   Chronic??Low??Back??Pain:   -Will start on oral diclofenac regimen   -Encouraged to see PMR for evaluation   -Follow-up in 1 month for well man visit         Health Maintenance:   -Flu shot given today            Eugenie Norrie, MD   10/20/2017, 3:08 PM

## 2017-10-20 NOTE — Progress Notes (Signed)
1. Have you been to the ER, urgent care clinic or hospitalized since your last visit? YES. ER for heat exposure    2. Have you seen or consulted any other health care providers outside of the [facility] since your last visit (Include any pap smears or colon screening)? Yes, started seeing a therapist. Appt with psychiatry tomorrow.      Do you have an Advanced Directive? NO    Would you like information on Advanced Directives? NO  Lelon Perlaicholas Piech is a 27 y.o. male who presents for routine immunizations.   He denies any symptoms , reactions or allergies that would exclude them from being immunized today.  Risks and adverse reactions were discussed and the VIS was given to them. All questions were addressed.  He was observed for 10 min post injection. There were no reactions observed.    Cathren Laineachel H Glinke, LPN

## 2017-10-21 ENCOUNTER — Encounter: Attending: Family Medicine | Primary: Family Medicine

## 2017-10-28 ENCOUNTER — Encounter: Attending: Rehabilitative and Restorative Service Providers" | Primary: Family Medicine

## 2017-10-30 ENCOUNTER — Encounter: Attending: Cardiovascular Disease | Primary: Family Medicine

## 2017-11-04 NOTE — Telephone Encounter (Signed)
Pt called and cancelled his physical therapy appt with Talbert Forest.  He did not reschedule since he is moving to [facility].     appt cacncelled.

## 2017-11-05 ENCOUNTER — Encounter: Attending: Rehabilitative and Restorative Service Providers" | Primary: Family Medicine

## 2017-11-26 ENCOUNTER — Encounter: Primary: Family Medicine

## 2017-12-03 ENCOUNTER — Encounter: Attending: Family Medicine | Primary: Family Medicine

## 2017-12-17 LAB — HEMOGLOBIN A1C - EXTERNAL
Estimated Mean Glucose: 88 mg/dL — NL
Hemoglobin A1C: 4.7 % — NL (ref <5.7)

## 2018-03-29 ENCOUNTER — Telehealth: Payer: Self-pay

## 2018-03-29 NOTE — Telephone Encounter (Signed)
Copied from CRM [id number]. Topic: Appointment Scheduling - New Patient >> Mar 29, 2018  2:38 PM Jaquita Rector A wrote: New patient has been scheduled for your office. Provider: Jen Mow Date of Appointment: 03/30/2018  Route to department's PEC pool.

## 2018-03-30 ENCOUNTER — Telehealth: Payer: Self-pay | Admitting: Family Medicine

## 2018-03-30 ENCOUNTER — Ambulatory Visit (INDEPENDENT_AMBULATORY_CARE_PROVIDER_SITE_OTHER): Admitting: Family Medicine

## 2018-03-30 ENCOUNTER — Encounter: Payer: Self-pay | Admitting: Family Medicine

## 2018-03-30 VITALS — BP 98/60 | HR 59 | Temp 98.5°F | Resp 16 | Ht 67.5 in | Wt 170.8 lb

## 2018-03-30 DIAGNOSIS — F319 Bipolar disorder, unspecified: Secondary | ICD-10-CM | POA: Diagnosis not present

## 2018-03-30 DIAGNOSIS — K219 Gastro-esophageal reflux disease without esophagitis: Secondary | ICD-10-CM

## 2018-03-30 DIAGNOSIS — F32A Depression, unspecified: Secondary | ICD-10-CM

## 2018-03-30 DIAGNOSIS — F419 Anxiety disorder, unspecified: Secondary | ICD-10-CM

## 2018-03-30 DIAGNOSIS — F329 Major depressive disorder, single episode, unspecified: Secondary | ICD-10-CM | POA: Diagnosis not present

## 2018-03-30 DIAGNOSIS — Q249 Congenital malformation of heart, unspecified: Secondary | ICD-10-CM

## 2018-03-30 LAB — COMPREHENSIVE METABOLIC PANEL
ALT: 15 U/L (ref 0–53)
AST: 14 U/L (ref 0–37)
Albumin: 4.7 g/dL (ref 3.5–5.2)
Alkaline Phosphatase: 92 U/L (ref 39–117)
BUN: 11 mg/dL (ref 6–23)
CO2: 26 meq/L (ref 19–32)
Calcium: 9.8 mg/dL (ref 8.4–10.5)
Chloride: 105 mEq/L (ref 96–112)
Creatinine, Ser: 0.93 mg/dL (ref 0.40–1.50)
GFR: 96.78 mL/min (ref >60.00)
Glucose, Bld: 89 mg/dL (ref 70–99)
Potassium: 3.8 mEq/L (ref 3.5–5.1)
Sodium: 140 mEq/L (ref 135–145)
TOTAL PROTEIN: 7.4 g/dL (ref 6.0–8.3)
Total Bilirubin: 0.7 mg/dL (ref 0.2–1.2)

## 2018-03-30 LAB — CBC
HCT: 44 % (ref 39.0–52.0)
Hemoglobin: 15.1 g/dL (ref 13.0–17.0)
MCHC: 34.4 g/dL (ref 30.0–36.0)
MCV: 87 fl (ref 78.0–100.0)
Platelets: 280 10*3/uL (ref 150.0–400.0)
RBC: 5.06 Mil/uL (ref 4.22–5.81)
RDW: 13 % (ref 11.5–15.5)
WBC: 5.6 10*3/uL (ref 4.0–10.5)

## 2018-03-30 LAB — B12 AND FOLATE PANEL
Folate: 13.5 ng/mL (ref >5.9)
Vitamin B-12: 303 pg/mL (ref 211–911)

## 2018-03-30 LAB — VITAMIN D 25 HYDROXY (VIT D DEFICIENCY, FRACTURES): VITD: 24.55 ng/mL — ABNORMAL LOW (ref 30.00–100.00)

## 2018-03-30 MED ORDER — LURASIDONE HCL 80 MG PO TABS: 80.0000 mg | ORAL_TABLET | Freq: Every day | ORAL | 1 refills | Status: DC

## 2018-03-30 MED ORDER — PROPRANOLOL HCL ER 60 MG PO CP24: 60.0000 mg | ORAL_CAPSULE | Freq: Every day | ORAL | 1 refills | Status: DC

## 2018-03-30 MED ORDER — PANTOPRAZOLE SODIUM 40 MG PO TBEC: 40.0000 mg | DELAYED_RELEASE_TABLET | Freq: Every day | ORAL | 1 refills | Status: DC

## 2018-03-30 MED ORDER — LAMOTRIGINE 200 MG PO TABS: 200.0000 mg | ORAL_TABLET | Freq: Every day | ORAL | 1 refills | Status: DC

## 2018-03-30 MED ORDER — LORAZEPAM 1 MG PO TABS: 1.0000 mg | ORAL_TABLET | Freq: Three times a day (TID) | ORAL | 1 refills | Status: AC | PRN

## 2018-03-30 NOTE — Telephone Encounter (Signed)
Pt states he needs referral for behavioral health psychiatry  for ins billing. Pt has a appt in June already scheduled. Please advise? Thank you!  Call pt @ [phone]

## 2018-03-30 NOTE — Telephone Encounter (Signed)
Called Pt to tell him that the referral was put in for him

## 2018-03-30 NOTE — Telephone Encounter (Signed)
Referral in

## 2018-03-30 NOTE — Progress Notes (Signed)
Subjective:    Patient ID: Andrew [name], male    DOB: 1990-09-08, 28 y.o.   MRN: [id number]  HPI   Patient presents to clinic to establish with PCP.  Main concern today is getting medications refilled and a referral to psychiatry.  Patient states she has been diagnosed with bipolar and depressive disorder.  He has been going through a rough time recently due to his husband leaving him, and going through a divorce proceedings.  Patient was living in [place], but moved to [place] to live with his parents for the time being.  Patient is originally from [place], and now being [place] where he does not know anyone or have any friends has been a tough adjustment.  In addition to going through a divorce, patient is upset that he is almost 28 years old and living back with his parents.  Currently patient is on Lamictal 150 mg daily and Latuda 80 mg daily to help control mood.  Patient states his depression is worse lately and he believes this is directly related to his divorce and having to live with his parents in a new state where he does not know anyone.  Patient states he has struggled with suicidal thoughts in the past, denies suicidal thoughts at this time.  Denies homicidal thoughts.  Patient also uses Ativan 1 mg 3 times daily.  Patient states he has been using this higher dose due to having more anxiety breakthrough related to all of the things going on in his personal life.  Patient also has a history of GERD, takes pantoprazole 40 mg daily with good effect.  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  Heart rate is controlled with propranolol 60 mg daily.  Patient Active Problem List   Diagnosis Date Noted  . Bipolar 1 disorder (HCC) 03/31/2018  . Depressive disorder 03/31/2018  . GERD without esophagitis 03/31/2018  . Cardiac arrhythmia due to congenital heart disease 03/31/2018  . Anxiety 03/31/2018   Past Medical History:    Diagnosis Date  . Cardiac arrhythmia due to congenital heart disease   . Depression    Social History   Tobacco Use  . Smoking status: Former Games developer  . Smokeless tobacco: Never Used  Substance Use Topics  . Alcohol use: Yes    Comment: ocassionally   Family History  Problem Relation Age of Onset  . Alcohol abuse Mother   . Depression Mother   . Mental illness Mother   . Asthma Father   . Diabetes Father   . Hyperlipidemia Paternal Grandfather   . Heart attack Paternal Grandfather     Review of Systems  Constitutional: Negative for chills, fatigue and fever.  HENT: Negative for congestion, ear pain, sinus pain and sore throat.   Eyes: Negative.   Respiratory: Negative for cough, shortness of breath and wheezing.   Cardiovascular: Negative for chest pain, palpitations and leg swelling.  Gastrointestinal: Negative for abdominal pain, diarrhea, nausea and vomiting.  Genitourinary: Negative for dysuria, frequency and urgency.  Musculoskeletal: Negative for arthralgias and myalgias.  Skin: Negative for color change, pallor and rash.  Neurological: Negative for syncope, light-headedness and headaches.  Psychiatric/Behavioral: +anxiety and depression.       Objective:   Physical Exam  Constitutional: He appears well-developed and well-nourished. No distress.  HENT:  Head: Normocephalic and atraumatic.  Eyes: Pupils are equal, round, and reactive to light. Conjunctivae and EOM are normal. No scleral icterus.  Neck: Normal range  of motion. Neck supple. No tracheal deviation present.  Cardiovascular: Normal rate, regular rhythm and normal heart sounds.  Pulmonary/Chest: Effort normal and breath sounds normal. No respiratory distress. He has no wheezes. He has no rales.  Abdominal: Soft. Bowel sounds are normal. There is no tenderness.  Neurological: He is alert and oriented to person, place, and time.  Gait normal  Skin: Skin is warm and dry. He is not diaphoretic. No  pallor.  Psychiatric: He has a normal mood and affect. His behavior is normal. Thought content normal.  Well-groomed.  Good eye contact.  Can clearly express thoughts.  Nursing note and vitals reviewed.  Vitals:   03/30/18 1422  BP: 98/60  Pulse: (!) 59  Resp: 16  Temp: 98.5 F (36.9 C)  SpO2: 97%      Assessment & Plan:    Bipolar disorder, depressive disorder, anxiety- due to patient's increasing depression, he is agreeable to trialing a higher dose of Lamictal.  We will increase to 200 mg daily.  He will continue Latuda as he has already been taking.  He will also use Ativan as needed for anxiety breakthrough.  I am hopeful that the increase in Lamictal will allow patient to rely less heavily on Ativan.  Patient does have psychiatry appointment planned for June 2020, he is on cancellation list.  Psychiatry referral placed because patient's insurance requires referrals for specialty.  GERD-pantoprazole refilled.  Cardiac arrhythmia- propanolol refilled.  Blood work drawn in clinic.  Patient will return to clinic in 4 weeks for recheck on mood after increasing Lamictal.  He is aware he can return to clinic sooner if any issues arise.

## 2018-03-30 NOTE — Telephone Encounter (Signed)
Pt states he needs referral for behavioral health psychiatry  for ins billing. Pt has a appt in June already scheduled. Please advise? Thank you!  Call pt @ [phone] 

## 2018-03-31 ENCOUNTER — Encounter: Payer: Self-pay | Admitting: Family Medicine

## 2018-03-31 DIAGNOSIS — F319 Bipolar disorder, unspecified: Secondary | ICD-10-CM | POA: Insufficient documentation

## 2018-03-31 DIAGNOSIS — K219 Gastro-esophageal reflux disease without esophagitis: Secondary | ICD-10-CM | POA: Insufficient documentation

## 2018-03-31 DIAGNOSIS — Q249 Congenital malformation of heart, unspecified: Secondary | ICD-10-CM | POA: Insufficient documentation

## 2018-03-31 DIAGNOSIS — F32A Depression, unspecified: Secondary | ICD-10-CM | POA: Insufficient documentation

## 2018-03-31 DIAGNOSIS — F329 Major depressive disorder, single episode, unspecified: Secondary | ICD-10-CM | POA: Insufficient documentation

## 2018-03-31 DIAGNOSIS — F419 Anxiety disorder, unspecified: Secondary | ICD-10-CM | POA: Insufficient documentation

## 2018-04-02 LAB — THYROID PANEL WITH TSH
Free Thyroxine Index: 2.6 (ref 1.4–3.8)
T3 Uptake: 32 % (ref 22–35)
T4, Total: 8 ug/dL (ref 4.9–10.5)
TSH: 1.77 mIU/L (ref 0.40–4.50)

## 2018-04-02 LAB — LAMOTRIGINE LEVEL: LAMOTRIGINE LVL: 4.5 ug/mL (ref 4.0–18.0)

## 2018-04-05 NOTE — Telephone Encounter (Signed)
Ok thank you 

## 2018-04-12 ENCOUNTER — Emergency Department

## 2018-04-12 ENCOUNTER — Emergency Department
Admission: EM | Admit: 2018-04-12 | Discharge: 2018-04-12 | Disposition: A | Discharge status: Home / Self Care | Attending: Emergency Medicine | Admitting: Emergency Medicine

## 2018-04-12 ENCOUNTER — Other Ambulatory Visit: Payer: Self-pay

## 2018-04-12 ENCOUNTER — Encounter: Payer: Self-pay | Admitting: Emergency Medicine

## 2018-04-12 DIAGNOSIS — F419 Anxiety disorder, unspecified: Secondary | ICD-10-CM | POA: Diagnosis not present

## 2018-04-12 DIAGNOSIS — Z87891 Personal history of nicotine dependence: Secondary | ICD-10-CM | POA: Insufficient documentation

## 2018-04-12 DIAGNOSIS — R000 Tachycardia, unspecified: Secondary | ICD-10-CM | POA: Diagnosis present

## 2018-04-12 DIAGNOSIS — R079 Chest pain, unspecified: Secondary | ICD-10-CM | POA: Diagnosis not present

## 2018-04-12 DIAGNOSIS — Z79899 Other long term (current) drug therapy: Secondary | ICD-10-CM | POA: Diagnosis not present

## 2018-04-12 DIAGNOSIS — F319 Bipolar disorder, unspecified: Secondary | ICD-10-CM | POA: Insufficient documentation

## 2018-04-12 HISTORY — DX: Supraventricular tachycardia: I47.1

## 2018-04-12 HISTORY — DX: Irritable bowel syndrome, unspecified: K58.9

## 2018-04-12 HISTORY — DX: Barrett's esophagus without dysplasia: K22.70

## 2018-04-12 HISTORY — DX: Supraventricular tachycardia, unspecified: I47.10

## 2018-04-12 HISTORY — DX: Bipolar disorder, unspecified: F31.9

## 2018-04-12 LAB — CBC
HCT: 42.5 % (ref 39.0–52.0)
Hemoglobin: 14.7 g/dL (ref 13.0–17.0)
MCH: 30.1 pg (ref 26.0–34.0)
MCHC: 34.6 g/dL (ref 30.0–36.0)
MCV: 87.1 fL (ref 80.0–100.0)
NRBC: 0 % (ref 0.0–0.2)
Platelets: 225 10*3/uL (ref 150–400)
RBC: 4.88 MIL/uL (ref 4.22–5.81)
RDW: 12 % (ref 11.5–15.5)
WBC: 7.1 10*3/uL (ref 4.0–10.5)

## 2018-04-12 LAB — BASIC METABOLIC PANEL
Anion gap: 9 (ref 5–15)
BUN: 12 mg/dL (ref 6–20)
CHLORIDE: 107 mmol/L (ref 98–111)
CO2: 22 mmol/L (ref 22–32)
Calcium: 9.3 mg/dL (ref 8.9–10.3)
Creatinine, Ser: 0.85 mg/dL (ref 0.61–1.24)
GFR calc Af Amer: >60 mL/min (ref >60)
GFR calc non Af Amer: >60 mL/min (ref >60)
Glucose, Bld: 113 mg/dL — ABNORMAL HIGH (ref 70–99)
POTASSIUM: 3.5 mmol/L (ref 3.5–5.1)
Sodium: 138 mmol/L (ref 135–145)

## 2018-04-12 LAB — TROPONIN I
Troponin I: <0.03 ng/mL (ref <0.03)
Troponin I: <0.03 ng/mL (ref <0.03)

## 2018-04-12 LAB — MAGNESIUM: Magnesium: 2.2 mg/dL (ref 1.7–2.4)

## 2018-04-12 NOTE — ED Triage Notes (Signed)
Pt says he woke up just before coming in with a sensation his heart was beating too fast; did not check pulse but bared down and then felt like his heart was in a normal rhythm again; history of SVT-cardiac ablation in 2017; pt says he has had chest pain since this episode; center of chest, tightness and pressure;

## 2018-04-12 NOTE — Discharge Instructions (Signed)
Your blood tests and chest x-ray today were okay.  Please follow-up with your cardiologist or primary care doctor for continued monitoring of your symptoms.

## 2018-04-12 NOTE — ED Notes (Signed)
Pt ambulatory with steady gait to stat desk to inquire about wait time; says he's "getting exhausted"; updated at this time

## 2018-04-12 NOTE — ED Provider Notes (Signed)
[facility] Emergency Department Provider Note  ____________________________________________  Time seen: Approximately 6:27 AM  I have reviewed the triage vital signs and the nursing notes.   HISTORY  Chief Complaint Chest Pain    HPI Andrew [name] is a 28 y.o. male with a history of bipolar disorder, depression, IBS, SVT who reports that he woke up at about 130 this morning with racing heartbeat.  He performed a Valsalva maneuver and it felt like his heart went back to normal.  The fast heart rate feeling lasted for a total of about 15 minutes.  No dizziness or syncope.  Afterward he has felt like he has tightness in the anterior chest, nonradiating, no aggravating or alleviating factors, no shortness of breath diaphoresis or vomiting.  Takes propranolol with which she has been compliant to to control SVT.    Past Medical History:  Diagnosis Date  . Barrett esophagus   . Bipolar 1 disorder, depressed (HCC)   . Cardiac arrhythmia due to congenital heart disease   . Depression   . Irritable bowel syndrome (IBS)   . SVT (supraventricular tachycardia) [facility])      Patient Active Problem List   Diagnosis Date Noted  . Bipolar 1 disorder (HCC) 03/31/2018  . Depressive disorder 03/31/2018  . GERD without esophagitis 03/31/2018  . Cardiac arrhythmia due to congenital heart disease 03/31/2018  . Anxiety 03/31/2018     Past Surgical History:  Procedure Laterality Date  . ABLATION       Prior to Admission medications   Medication Sig Start Date End Date Taking? Authorizing Provider  calcium-vitamin D (OSCAL WITH D) 500-200 MG-UNIT tablet Take 1 tablet by mouth.   Yes [provider]  lamoTRIgine (LAMICTAL) 200 MG tablet Take 1 tablet (200 mg total) by mouth daily. 03/30/18  Yes Guse, Janna Arch, FNP  LORazepam (ATIVAN) 1 MG tablet Take 1 tablet (1 mg total) by mouth every 8 (eight) hours as needed for anxiety. 03/30/18  Yes Guse, Janna Arch, FNP   lurasidone (LATUDA) 80 MG TABS tablet Take 1 tablet (80 mg total) by mouth daily with breakfast. 03/30/18  Yes Guse, Janna Arch, FNP  pantoprazole (PROTONIX) 40 MG tablet Take 1 tablet (40 mg total) by mouth daily. 03/30/18  Yes Guse, Janna Arch, FNP  propranolol ER (INDERAL LA) 60 MG 24 hr capsule Take 1 capsule (60 mg total) by mouth daily. 03/30/18  Yes Guse, Janna Arch, FNP     Allergies Beef-derived products; Grapefruit flavor [flavoring agent]; and Nsaids   Family History  Problem Relation Age of Onset  . Alcohol abuse Mother   . Depression Mother   . Mental illness Mother   . Asthma Father   . Diabetes Father   . Hyperlipidemia Paternal Grandfather   . Heart attack Paternal Grandfather     Social History Social History   Tobacco Use  . Smoking status: Former Games developer  . Smokeless tobacco: Never Used  Substance Use Topics  . Alcohol use: Yes    Comment: ocassionally  . Drug use: Yes    Types: Marijuana    Comment: last smoked a few weeks ago    Review of Systems  Constitutional:   No fever or chills.  ENT:   No sore throat. No rhinorrhea. Cardiovascular:   Positive as above chest tightness without syncope. Respiratory:   No dyspnea or cough. Gastrointestinal:   Negative for abdominal pain, vomiting and diarrhea.  Musculoskeletal:   Negative for focal pain or swelling All  other systems reviewed and are negative except as documented above in ROS and HPI.  ____________________________________________   PHYSICAL EXAM:  VITAL SIGNS: ED Triage Vitals  Enc Vitals Group     BP 04/12/18 0221 114/75     Pulse Rate 04/12/18 0221 80     Resp 04/12/18 0221 17     Temp 04/12/18 0221 98.7 F (37.1 C)     Temp Source 04/12/18 0221 Oral     SpO2 04/12/18 0221 96 %     Weight 04/12/18 0223 169 lb (76.7 kg)     Height 04/12/18 0223 5\' 8"  (1.727 m)     Head Circumference --      Peak Flow --      Pain Score 04/12/18 0221 6     Pain Loc --      Pain Edu? --      Excl. in GC?  --     Vital signs reviewed, nursing assessments reviewed.   Constitutional:   Alert and oriented. Non-toxic appearance. Eyes:   Conjunctivae are normal. EOMI. PERRL. ENT      Head:   Normocephalic and atraumatic.      Nose:   No congestion/rhinnorhea.       Mouth/Throat:   MMM, no pharyngeal erythema. No peritonsillar mass.       Neck:   No meningismus. Full ROM. Hematological/Lymphatic/Immunilogical:   No cervical lymphadenopathy. Cardiovascular:   RRR. Symmetric bilateral radial and DP pulses.  No murmurs. Cap refill less than 2 seconds. Respiratory:   Normal respiratory effort without tachypnea/retractions. Breath sounds are clear and equal bilaterally. No wheezes/rales/rhonchi. Gastrointestinal:   Soft and nontender. Non distended. There is no CVA tenderness.  No rebound, rigidity, or guarding. Musculoskeletal:   Normal range of motion in all extremities. No joint effusions.  No lower extremity tenderness.  No edema. Neurologic:   Normal speech and language.  Motor grossly intact. No acute focal neurologic deficits are appreciated.  Skin:    Skin is warm, dry and intact. No rash noted.  No petechiae, purpura, or bullae.  ____________________________________________    LABS (pertinent positives/negatives) (all labs ordered are listed, but only abnormal results are displayed) Labs Reviewed  BASIC METABOLIC PANEL - Abnormal; Notable for the following components:      Result Value   Glucose, Bld 113 (*)    All other components within normal limits  CBC  TROPONIN I  MAGNESIUM  TROPONIN I   ____________________________________________   EKG  Interpreted by me  Date: 04/12/2018  Rate: 70  Rhythm: normal sinus rhythm  QRS Axis: normal  Intervals: normal  ST/T Wave abnormalities: normal  Conduction Disutrbances: none  Narrative Interpretation: unremarkable      ____________________________________________    RADIOLOGY  Dg Chest 2 View  Result Date:  04/12/2018 CLINICAL DATA:  Chest pain, shortness of Breath EXAM: CHEST - 2 VIEW COMPARISON:  None. FINDINGS: Heart and mediastinal contours are within normal limits. No focal opacities or effusions. No acute bony abnormality. IMPRESSION: No active cardiopulmonary disease. Electronically Signed   By: Charlett Nose M.D.   On: 04/12/2018 03:09    ____________________________________________   PROCEDURES Procedures  ____________________________________________    CLINICAL IMPRESSION / ASSESSMENT AND PLAN / ED COURSE  Medications ordered in the ED: Medications - No data to display  Pertinent labs & imaging results that were available during my care of the patient were reviewed by me and considered in my medical decision making (see chart for details).  Patient presents with chest tightness, nonspecific chest pain after an episode of palpitations/SVT that resolved at home.  Chest x-ray and EKG are unremarkable, labs are unremarkable including troponin x2.  Vital signs are normal.  Patient is nontoxic, calm and comfortable appearing.Considering the patient's symptoms, medical history, and physical examination today, I have low suspicion for ACS, PE, TAD, pneumothorax, carditis, mediastinitis, pneumonia, CHF, or sepsis.  Follow-up with primary care/cardiology.      ____________________________________________   FINAL CLINICAL IMPRESSION(S) / ED DIAGNOSES    Final diagnoses:  Nonspecific chest pain     ED Discharge Orders    None      Portions of this note were generated with dragon dictation software. Dictation errors may occur despite best attempts at proofreading.   Sharman Cheek, MD 04/12/18 [phone]

## 2018-04-12 NOTE — ED Notes (Signed)
Patient just had a 10 minute episode of SVT. Was able to break it by bearing down but is still having chest pain. Wants to "be checked out and make sure his heart is okay." No difficulty breathing at this time. History of same in the past.

## 2018-04-27 ENCOUNTER — Ambulatory Visit: Admitting: Family Medicine

## 2018-05-20 ENCOUNTER — Ambulatory Visit: Payer: Self-pay | Admitting: Psychiatry

## 2018-05-27 ENCOUNTER — Other Ambulatory Visit: Payer: Self-pay

## 2018-05-27 ENCOUNTER — Ambulatory Visit (INDEPENDENT_AMBULATORY_CARE_PROVIDER_SITE_OTHER): Admitting: Family Medicine

## 2018-05-27 ENCOUNTER — Encounter: Payer: Self-pay | Admitting: Family Medicine

## 2018-05-27 VITALS — BP 100/62 | HR 66 | Temp 98.7°F | Resp 16 | Ht 68.0 in | Wt 179.6 lb

## 2018-05-27 DIAGNOSIS — F319 Bipolar disorder, unspecified: Secondary | ICD-10-CM

## 2018-05-27 DIAGNOSIS — F419 Anxiety disorder, unspecified: Secondary | ICD-10-CM | POA: Diagnosis not present

## 2018-05-27 DIAGNOSIS — I471 Supraventricular tachycardia: Secondary | ICD-10-CM

## 2018-05-27 DIAGNOSIS — F329 Major depressive disorder, single episode, unspecified: Secondary | ICD-10-CM

## 2018-05-27 DIAGNOSIS — F32A Depression, unspecified: Secondary | ICD-10-CM

## 2018-05-27 NOTE — Progress Notes (Signed)
Subjective:    Patient ID: Andrew [name], male    DOB: [birth year], 28 y.o.   MRN: [id number]  HPI   Patient presents to clinic for follow-up on mood after increased dose of Lamictal and also for follow-up on SVT after going to the emergency department.  Patient states his mood has been good ever since increasing Lamictal dose.  He also feels he is adjusting well to life in [place].  Still has not yet been a job, believes the COVID-19 pandemic has a lot to do with the [employer] in his job search.  Denies any SI or HI currently.  Does have appointment with psychiatry planned for early June 2020.  Patient did go to the emergency department on 04/12/2018 due to episode of heart rate being elevated around 130s, has a hx of SVT & has had cardiac ablation. He performed Valsalva maneuver on his own at home.  Patient was concerned may also possibly be wrong so he decided to go to emergency room to get checked out.  Lab work, EKG performed revealing no acute issues and patient was discharged home on current medications.  For the most part when he takes his propranolol, heart rate remained stable.  He does not have a cardiologist yet in [place], we will get him set up with one.   Patient Active Problem List   Diagnosis Date Noted  . Bipolar 1 disorder (HCC) 03/31/2018  . Depressive disorder 03/31/2018  . GERD without esophagitis 03/31/2018  . Cardiac arrhythmia due to congenital heart disease 03/31/2018  . Anxiety 03/31/2018   Past Surgical History:  Procedure Laterality Date  . ABLATION     Social History   Tobacco Use  . Smoking status: Former Games developer  . Smokeless tobacco: Never Used  Substance Use Topics  . Alcohol use: Yes    Comment: ocassionally   Review of Systems   Constitutional: Negative for chills, fatigue and fever.  HENT: Negative for congestion, ear pain, sinus pain and sore throat.   Eyes: Negative.   Respiratory: Negative for cough, shortness of breath and  wheezing.   Cardiovascular: Negative for chest pain, palpitations and leg swelling.  Gastrointestinal: Negative for abdominal pain, diarrhea, nausea and vomiting.  Genitourinary: Negative for dysuria, frequency and urgency.  Musculoskeletal: Negative for arthralgias and myalgias.  Skin: Negative for color change, pallor and rash.  Neurological: Negative for syncope, light-headedness and headaches.  Psychiatric/Behavioral: The patient is not nervous/anxious.       Objective:   Physical Exam Vitals signs and nursing note reviewed.  Constitutional:      General: He is not in acute distress.    Appearance: He is not toxic-appearing.  HENT:     Head: Normocephalic and atraumatic.  Eyes:     General: No scleral icterus.    Extraocular Movements: Extraocular movements intact.     Pupils: Pupils are equal, round, and reactive to light.  Neck:     Musculoskeletal: Neck supple. No neck rigidity.  Cardiovascular:     Rate and Rhythm: Normal rate and regular rhythm.  Pulmonary:     Effort: Pulmonary effort is normal. No respiratory distress.     Breath sounds: Normal breath sounds.  Skin:    General: Skin is warm and dry.     Coloration: Skin is not jaundiced or pale.     Findings: No erythema.  Neurological:     Mental Status: He is alert and oriented to person, place, and time.  Gait: Gait normal.  Psychiatric:        Mood and Affect: Mood normal.        Behavior: Behavior normal.        Thought Content: Thought content normal.        Judgment: Judgment normal.     Vitals:   05/27/18 1316  BP: 100/62  Pulse: 66  Resp: 16  Temp: 98.7 F (37.1 C)  SpO2: 98%      Assessment & Plan:   Bipolar disorder, depressive disorder and anxiety-mood is stable and increase Lamictal dose.  He will also continue current dose of Latuda and lorazepam.  Will keep appointment as planned with psychiatry.  SVT- patient's heart rate is well controlled on propranolol.  We will get him set up  with a cardiologist just for further evaluation and management.  Follow-up in 3 months for recheck of chronic medical conditions.  He is aware he can return to clinic sooner if any issues arise.

## 2018-05-31 LAB — LAMOTRIGINE LEVEL: Lamotrigine Lvl: 5.9 ug/mL (ref 4.0–18.0)

## 2018-06-02 ENCOUNTER — Telehealth: Payer: Self-pay

## 2018-06-02 NOTE — Telephone Encounter (Signed)
Called patient.  No answer. LMOV. Need to preform a Doxy trial.

## 2018-06-04 ENCOUNTER — Telehealth (INDEPENDENT_AMBULATORY_CARE_PROVIDER_SITE_OTHER): Admitting: Cardiovascular Disease

## 2018-06-04 ENCOUNTER — Other Ambulatory Visit: Payer: Self-pay

## 2018-06-04 DIAGNOSIS — I471 Supraventricular tachycardia, unspecified: Secondary | ICD-10-CM | POA: Insufficient documentation

## 2018-06-04 DIAGNOSIS — F32A Depression, unspecified: Secondary | ICD-10-CM

## 2018-06-04 DIAGNOSIS — F319 Bipolar disorder, unspecified: Secondary | ICD-10-CM

## 2018-06-04 DIAGNOSIS — Q249 Congenital malformation of heart, unspecified: Secondary | ICD-10-CM

## 2018-06-04 DIAGNOSIS — F419 Anxiety disorder, unspecified: Secondary | ICD-10-CM

## 2018-06-04 DIAGNOSIS — K589 Irritable bowel syndrome without diarrhea: Secondary | ICD-10-CM

## 2018-06-04 DIAGNOSIS — F329 Major depressive disorder, single episode, unspecified: Secondary | ICD-10-CM

## 2018-06-04 NOTE — Patient Instructions (Addendum)
We will order a zio monitor for SVT (regular), paroxysmal tachycardia  Once we document the arrhythmia, we will refer you to EP   We will request records from [facility] [place] [facility]  Ablation  Details , echo, etc   Medication Instructions:  No changes  If you need a refill on your cardiac medications before your next appointment, please call your pharmacy.    Lab work: Your physician has recommended that you wear a 14 day heart monitor (ZIO patch) - This will be mailed directly to your home address - You may receive a call directly from the company for Zio, which is [employer] within the next few day- if you see an 800# or 224# calling, please answer - Once the monitor is applied and activated it will record your heart rate/ rhythm for the duration that you are wearing it - If you are having any symptoms (dizziness/ lightheadedness/ palpitations/ racing heart/ anything that just doesn't feel right) then you will push the button in the center of the monitor to mark you were having a symptom - Please DO NOT shower for 24 hours after the monitor is placed - No tub baths/ swimming pools/ hot tubs while wearing the monitor - Do not put any lotions, oils, or ointments around the monitor - If you have any issues with the monitor itself, then please call the 800 # for the company - After 14 days please take the monitor off at home and mail it back ([place] mail) in the box provided by [employer], postage is already paid.    If you have labs (blood work) drawn today and your tests are completely normal, you will receive your results only by: Marland Kitchen MyChart Message (if you have MyChart) OR . A paper copy in the mail If you have any lab test that is abnormal or we need to change your treatment, we will call you to review the results.   Testing/Procedures: No new testing needed   Follow-Up: At [facility], you and your health needs are our priority.  As part of our continuing mission to provide you with  exceptional heart care, we have created designated Provider Care Teams.  These Care Teams include your primary Cardiologist (physician) and Advanced Practice Providers (APPs -  Physician Assistants and Nurse Practitioners) who all work together to provide you with the care you need, when you need it.  . You will need a follow up appointment as needed  . Providers on your designated Care Team:   . Nicolasa Ducking, NP . Eula Listen, PA-C . Marisue Ivan, PA-C  Any Other Special Instructions Will Be Listed Below (If Applicable).  For educational health videos Log in to : [email or web address] Or : [email or web address], password : [email or web address]

## 2018-06-04 NOTE — Progress Notes (Signed)
Virtual Visit via Video Note   This visit type was conducted due to national recommendations for restrictions regarding the COVID-19 Pandemic (e.g. social distancing) in an effort to limit this patient's exposure and mitigate transmission in our community.  Due to his co-morbid illnesses, this patient is at least at moderate risk for complications without adequate follow up.  This format is felt to be most appropriate for this patient at this time.  All issues noted in this document were discussed and addressed.  A limited physical exam was performed with this format.  Please refer to the patient's chart for his consent to telehealth for [facility].   I connected with  Andrew [name] on 06/04/18 by a video enabled telemedicine application and verified that I am speaking with the correct person using two identifiers. I discussed the limitations of evaluation and management by telemedicine. The patient expressed understanding and agreed to proceed.   Evaluation Performed:  Follow-up visit  Date:  06/04/2018   ID:  Andrew [name], DOB Oct 11, 1990, MRN [id number]  Patient Location:  [address] [place] [address]   Provider location:   [facility], [place] office  PCP:  Tracey Harries, FNP  Cardiologist:  Fonnie Mu   Chief Complaint: Paroxysmal tachycardia, SVT    History of Present Illness:    Andrew [name] is a 28 y.o. male who presents via audio/video conferencing for a telehealth visit today.   The patient does not symptoms concerning for COVID-19 infection (fever, chills, cough, or new SHORTNESS OF BREATH).   Patient has a past medical history of SVT, ablation 2017 in [place] Bipolar disorder Depression IBS Referred by Leanora Cover for consultation of his SVT  He reports long history of SVT dating back many years He has had too many episodes to count over the years Previous work-up at [facility] with ablation 2017 Reports it was  unsuccessful as he has continued to have symptoms Has episodes approximately once a month, sometimes every other month  Typically can make his tachycardia break with Valsalva Recent episode was unable to get the rhythm to break Weight 160 up to 170, Sometimes frustrating as it will present at awkward times It will make him short of breath, tired Longest episode 30 minutes  He is currently taking propranolol 60 er daily in effort to suppress his arrhythmia  Blood pressure low, unable to advance the dose Reports having previous work-up such as echocardiogram at [facility] These records have been requested  Prior CV studies:   The following studies were reviewed today:   Past Medical History:  Diagnosis Date  . Barrett esophagus   . Bipolar 1 disorder, depressed (HCC)   . Cardiac arrhythmia due to congenital heart disease   . Depression   . Irritable bowel syndrome (IBS)   . SVT (supraventricular tachycardia) (HCC)    Past Surgical History:  Procedure Laterality Date  . ABLATION       Current Meds  Medication Sig  . calcium-vitamin D (OSCAL WITH D) 500-200 MG-UNIT tablet Take 1 tablet by mouth.  . lamoTRIgine (LAMICTAL) 200 MG tablet Take 1 tablet (200 mg total) by mouth daily.  Marland Kitchen LORazepam (ATIVAN) 1 MG tablet Take 1 tablet (1 mg total) by mouth every 8 (eight) hours as needed for anxiety.  Marland Kitchen lurasidone (LATUDA) 80 MG TABS tablet Take 1 tablet (80 mg total) by mouth daily with breakfast.  . pantoprazole (PROTONIX) 40 MG tablet Take 1  tablet (40 mg total) by mouth daily.  . propranolol ER (INDERAL LA) 60 MG 24 hr capsule Take 1 capsule (60 mg total) by mouth daily.     Allergies:   Beef-derived products; Grapefruit flavor [flavoring agent]; and Nsaids   Social History   Tobacco Use  . Smoking status: Former Games developer  . Smokeless tobacco: Never Used  Substance Use Topics  . Alcohol use: Yes    Comment: ocassionally  . Drug use: Yes    Types: Marijuana    Comment: last  smoked a few weeks ago     Current Outpatient Medications on File Prior to Visit  Medication Sig Dispense Refill  . calcium-vitamin D (OSCAL WITH D) 500-200 MG-UNIT tablet Take 1 tablet by mouth.    . lamoTRIgine (LAMICTAL) 200 MG tablet Take 1 tablet (200 mg total) by mouth daily. 90 tablet 1  . LORazepam (ATIVAN) 1 MG tablet Take 1 tablet (1 mg total) by mouth every 8 (eight) hours as needed for anxiety. 90 tablet 1  . lurasidone (LATUDA) 80 MG TABS tablet Take 1 tablet (80 mg total) by mouth daily with breakfast. 90 tablet 1  . pantoprazole (PROTONIX) 40 MG tablet Take 1 tablet (40 mg total) by mouth daily. 90 tablet 1  . propranolol ER (INDERAL LA) 60 MG 24 hr capsule Take 1 capsule (60 mg total) by mouth daily. 90 capsule 1   No current facility-administered medications on file prior to visit.      Family Hx: The patient's family history includes Alcohol abuse in his mother; Asthma in his father; Depression in his mother; Diabetes in his father; Heart attack in his paternal grandfather; Hyperlipidemia in his paternal grandfather; Mental illness in his mother.  ROS:   Please see the history of present illness.    Review of Systems  Constitutional: Negative.   Respiratory: Negative.   Cardiovascular: Negative.        Paroxysmal tachycardia  Gastrointestinal: Negative.   Musculoskeletal: Negative.   Neurological: Negative.   Psychiatric/Behavioral: Negative.   All other systems reviewed and are negative.     Labs/Other Tests and Data Reviewed:    Recent Labs: 03/30/2018: ALT 15; TSH 1.77 04/12/2018: BUN 12; Creatinine, Ser 0.85; Hemoglobin 14.7; Magnesium 2.2; Platelets 225; Potassium 3.5; Sodium 138   Recent Lipid Panel No results found for: CHOL, [name], HDL, CHOLHDL, LDLCALC, LDLDIRECT  Wt Readings from Last 3 Encounters:  05/27/18 179 lb 9.6 oz (81.5 kg)  04/12/18 169 lb (76.7 kg)  03/30/18 170 lb 12.8 oz (77.5 kg)     Exam:    Vital Signs: Vital signs may also be  detailed in the HPI There were no vitals taken for this visit.  Wt Readings from Last 3 Encounters:  05/27/18 179 lb 9.6 oz (81.5 kg)  04/12/18 169 lb (76.7 kg)  03/30/18 170 lb 12.8 oz (77.5 kg)   Temp Readings from Last 3 Encounters:  05/27/18 98.7 F (37.1 C) (Oral)  04/12/18 98.7 F (37.1 C) (Oral)  03/30/18 98.5 F (36.9 C) (Oral)   BP Readings from Last 3 Encounters:  05/27/18 100/62  04/12/18 108/77  03/30/18 98/60   Pulse Readings from Last 3 Encounters:  05/27/18 66  04/12/18 81  03/30/18 (!) 59    100/60, pulse 60 Respirations 16  Well nourished, well developed male in no acute distress. Constitutional:  oriented to person, place, and time. No distress.  Head: Normocephalic and atraumatic.  Eyes:  no discharge. No scleral icterus.  Neck:  Normal range of motion. Neck supple.  Pulmonary/Chest: No audible wheezing, no distress, appears comfortable Musculoskeletal: Normal range of motion.  no  tenderness or deformity.  Neurological:   Coordination normal. Full exam not performed Skin:  No rash Psychiatric:  normal mood and affect. behavior is normal. Thought content normal.    ASSESSMENT & PLAN:    SVT (supraventricular tachycardia) (HCC) Long discussion with him, recommend we try to document his arrhythmia Discussed various options with him including using a apple watch which he has versus portable monitoring device.  He prefers to wear a ZIO monitor 1 has been ordered for him May need repeat monitor if no arrhythmia identified Discussed referral to EP when he is ready Also discussed use of antiarrhythmics He prefers no additional medications at this time and will stay on propranolol Discussed obtaining EKG from local EMTs when he has arrhythmia  Bipolar affective disorder, remission status unspecified (HCC) Managed by primary care  Depression, unspecified depression type Recent divorce from wife, moved away from [place] to [place] to be with  his parents  Irritable bowel syndrome, unspecified type Followed by primary care  Patient was seen in consultation for Leanora Cover and will be referred back to her office for ongoing care of the issues detailed above  COVID-19 Education: The signs and symptoms of COVID-19 were discussed with the patient and how to seek care for testing (follow up with PCP or arrange E-visit).  The importance of social distancing was discussed today.  Patient Risk:   After full review of this patients clinical status, I feel that they are at least moderate risk at this time.  Time:   Today, I have spent 60 minutes with the patient with telehealth technology discussing the cardiac and medical problems/diagnoses detailed above   10 min spent reviewing the chart prior to patient visit today   Medication Adjustments/Labs and Tests Ordered: Current medicines are reviewed at length with the patient today.  Concerns regarding medicines are outlined above.   Tests Ordered: No tests ordered   Medication Changes: No changes made   Disposition: Follow-up as needed We will call him with the results of his monitor   Signed, Julien Nordmann, MD  06/04/2018 7:50 PM    [facility] [address], [place], [place] [address]

## 2018-06-18 ENCOUNTER — Telehealth: Payer: Self-pay

## 2018-06-18 DIAGNOSIS — F32A Depression, unspecified: Secondary | ICD-10-CM

## 2018-06-18 DIAGNOSIS — F329 Major depressive disorder, single episode, unspecified: Secondary | ICD-10-CM

## 2018-06-18 DIAGNOSIS — F419 Anxiety disorder, unspecified: Secondary | ICD-10-CM

## 2018-06-18 DIAGNOSIS — F319 Bipolar disorder, unspecified: Secondary | ICD-10-CM

## 2018-06-18 NOTE — Telephone Encounter (Signed)
Copied from CRM [id number]. Topic: Referral - Request for Referral >> Jun 18, 2018  1:25 PM Saverio Danker J wrote: Has patient seen PCP for this complaint? Yes.   *If NO, is insurance requiring patient see PCP for this issue before PCP can refer them? Referral for which specialty: psychology Preferred provider/office: Susy Frizzle  Reason for referral: already sees her, needs referral for insurance reason Cb is [phone]

## 2018-06-18 NOTE — Telephone Encounter (Signed)
Pt called [facility] NP Leanora Cover is out of office until 06/22/18, I told Pt she was out until then and he said it was okay to wait until she comes back. Referral for which specialty: psychology Preferred provider/office: Susy Frizzle  Reason for referral: already sees her, needs referral for insurance reason Cb is [phone]

## 2018-06-22 ENCOUNTER — Encounter: Payer: Self-pay | Admitting: Psychiatry

## 2018-06-22 ENCOUNTER — Other Ambulatory Visit: Payer: Self-pay

## 2018-06-22 ENCOUNTER — Ambulatory Visit (INDEPENDENT_AMBULATORY_CARE_PROVIDER_SITE_OTHER): Admitting: Psychiatry

## 2018-06-22 DIAGNOSIS — F4310 Post-traumatic stress disorder, unspecified: Secondary | ICD-10-CM

## 2018-06-22 DIAGNOSIS — F3130 Bipolar disorder, current episode depressed, mild or moderate severity, unspecified: Secondary | ICD-10-CM | POA: Diagnosis not present

## 2018-06-22 DIAGNOSIS — F1210 Cannabis abuse, uncomplicated: Secondary | ICD-10-CM | POA: Diagnosis not present

## 2018-06-22 MED ORDER — LURASIDONE HCL 80 MG PO TABS: 80.0000 mg | ORAL_TABLET | Freq: Every day | ORAL | 0 refills | Status: DC

## 2018-06-22 MED ORDER — LURASIDONE HCL 20 MG PO TABS: 20.0000 mg | ORAL_TABLET | Freq: Every day | ORAL | 0 refills | Status: DC

## 2018-06-22 MED ORDER — SERTRALINE HCL 25 MG PO TABS: 25.0000 mg | ORAL_TABLET | Freq: Every day | ORAL | 0 refills | Status: DC

## 2018-06-22 NOTE — Telephone Encounter (Signed)
Called Pt and scheduled him a lab appt for 06/28/2018 @ 2:30pm

## 2018-06-22 NOTE — Addendum Note (Signed)
Addended by: Leanora Cover on: 06/22/2018 02:12 PM   Modules accepted: Orders

## 2018-06-22 NOTE — Addendum Note (Signed)
Addended by: Leanora Cover on: 06/22/2018 03:36 PM   Modules accepted: Orders
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
## 2018-06-22 NOTE — Telephone Encounter (Signed)
Called Pt No answer left a VM that referral was put in to call office if he has any questions

## 2018-06-22 NOTE — Telephone Encounter (Signed)
Referral is in for patient.

## 2018-06-22 NOTE — Telephone Encounter (Signed)
I have placed lab orders for him  Please also call to get him scheduled for lab visit here -- Dr Elna Breslow, his psychiatrist, needs certain labs due to medication he takes  Thanks  LG

## 2018-06-25 ENCOUNTER — Other Ambulatory Visit: Payer: Self-pay | Admitting: Family Medicine

## 2018-06-25 DIAGNOSIS — F319 Bipolar disorder, unspecified: Secondary | ICD-10-CM

## 2018-06-25 DIAGNOSIS — F32A Depression, unspecified: Secondary | ICD-10-CM

## 2018-06-25 DIAGNOSIS — F329 Major depressive disorder, single episode, unspecified: Secondary | ICD-10-CM

## 2018-06-28 ENCOUNTER — Other Ambulatory Visit (INDEPENDENT_AMBULATORY_CARE_PROVIDER_SITE_OTHER)

## 2018-06-28 ENCOUNTER — Other Ambulatory Visit: Payer: Self-pay

## 2018-06-28 ENCOUNTER — Ambulatory Visit (INDEPENDENT_AMBULATORY_CARE_PROVIDER_SITE_OTHER)

## 2018-06-28 DIAGNOSIS — F32A Depression, unspecified: Secondary | ICD-10-CM

## 2018-06-28 DIAGNOSIS — F329 Major depressive disorder, single episode, unspecified: Secondary | ICD-10-CM | POA: Diagnosis not present

## 2018-06-28 DIAGNOSIS — F419 Anxiety disorder, unspecified: Secondary | ICD-10-CM | POA: Diagnosis not present

## 2018-06-28 DIAGNOSIS — I471 Supraventricular tachycardia: Secondary | ICD-10-CM

## 2018-06-28 DIAGNOSIS — F319 Bipolar disorder, unspecified: Secondary | ICD-10-CM | POA: Diagnosis not present

## 2018-06-29 LAB — CBC WITH DIFFERENTIAL/PLATELET
Basophils Absolute: 0 10*3/uL (ref 0.0–0.1)
Basophils Relative: 0.2 % (ref 0.0–3.0)
Eosinophils Absolute: 0.1 10*3/uL (ref 0.0–0.7)
Eosinophils Relative: 1.7 % (ref 0.0–5.0)
HCT: 43.8 % (ref 39.0–52.0)
Hemoglobin: 15.4 g/dL (ref 13.0–17.0)
Lymphocytes Relative: 39.8 % (ref 12.0–46.0)
Lymphs Abs: 2.2 10*3/uL (ref 0.7–4.0)
MCHC: 35.2 g/dL (ref 30.0–36.0)
MCV: 88.6 fl (ref 78.0–100.0)
Monocytes Absolute: 0.3 10*3/uL (ref 0.1–1.0)
Monocytes Relative: 5 % (ref 3.0–12.0)
Neutro Abs: 2.9 10*3/uL (ref 1.4–7.7)
Neutrophils Relative %: 53.3 % (ref 43.0–77.0)
Platelets: 304 10*3/uL (ref 150.0–400.0)
RBC: 4.94 Mil/uL (ref 4.22–5.81)
RDW: 12.8 % (ref 11.5–15.5)
WBC: 5.5 10*3/uL (ref 4.0–10.5)

## 2018-06-29 LAB — COMPREHENSIVE METABOLIC PANEL
ALT: 18 U/L (ref 0–53)
AST: 16 U/L (ref 0–37)
Albumin: 4.8 g/dL (ref 3.5–5.2)
Alkaline Phosphatase: 76 U/L (ref 39–117)
BUN: 15 mg/dL (ref 6–23)
CO2: 29 mEq/L (ref 19–32)
Calcium: 10.1 mg/dL (ref 8.4–10.5)
Chloride: 103 mEq/L (ref 96–112)
Creatinine, Ser: 1.08 mg/dL (ref 0.40–1.50)
GFR: 81.29 mL/min (ref >60.00)
Glucose, Bld: 74 mg/dL (ref 70–99)
Potassium: 4.5 mEq/L (ref 3.5–5.1)
Sodium: 139 mEq/L (ref 135–145)
Total Bilirubin: 0.9 mg/dL (ref 0.2–1.2)
Total Protein: 7.5 g/dL (ref 6.0–8.3)

## 2018-06-29 LAB — PROLACTIN: Prolactin: 5.1 ng/mL (ref 2.0–18.0)

## 2018-06-29 LAB — LIPID PANEL
Cholesterol: 216 mg/dL — ABNORMAL HIGH (ref 0–200)
HDL: 36.5 mg/dL — ABNORMAL LOW (ref >39.00)
LDL Cholesterol: 155 mg/dL — ABNORMAL HIGH (ref 0–99)
NonHDL: 179.42
Total CHOL/HDL Ratio: 6
Triglycerides: 124 mg/dL (ref 0.0–149.0)
VLDL: 24.8 mg/dL (ref 0.0–40.0)

## 2018-06-29 LAB — HEMOGLOBIN A1C: Hgb A1c MFr Bld: 4.8 % (ref 4.6–6.5)

## 2018-07-05 ENCOUNTER — Other Ambulatory Visit: Payer: Self-pay

## 2018-07-05 ENCOUNTER — Ambulatory Visit (INDEPENDENT_AMBULATORY_CARE_PROVIDER_SITE_OTHER): Admitting: Psychiatry

## 2018-07-05 ENCOUNTER — Encounter: Payer: Self-pay | Admitting: Psychiatry

## 2018-07-05 DIAGNOSIS — F4310 Post-traumatic stress disorder, unspecified: Secondary | ICD-10-CM | POA: Diagnosis not present

## 2018-07-05 DIAGNOSIS — F1210 Cannabis abuse, uncomplicated: Secondary | ICD-10-CM

## 2018-07-05 DIAGNOSIS — F3130 Bipolar disorder, current episode depressed, mild or moderate severity, unspecified: Secondary | ICD-10-CM | POA: Diagnosis not present

## 2018-07-05 MED ORDER — LURASIDONE HCL 80 MG PO TABS: 80.0000 mg | ORAL_TABLET | Freq: Every day | ORAL | 1 refills | Status: DC

## 2018-07-05 NOTE — Progress Notes (Signed)
Virtual Visit via Video Note  I connected with Andrew [name] on 07/05/18 at  2:15 PM EDT by a video enabled telemedicine application and verified that I am speaking with the correct person using two identifiers.   I discussed the limitations of evaluation and management by telemedicine and the availability of in person appointments. The patient expressed understanding and agreed to proceed.    I discussed the assessment and treatment plan with the patient. The patient was provided an opportunity to ask questions and all were answered. The patient agreed with the plan and demonstrated an understanding of the instructions.   The patient was advised to call back or seek an in-person evaluation if the symptoms worsen or if the condition fails to improve as anticipated.   BH MD OP Progress Note  07/05/2018 6:04 PM Andrew [name]  MRN:  [id number]  Chief Complaint:  Chief Complaint    Follow-up     HPI: Andrew [name] is a 28 year old Caucasian male, currently going through a divorce, employed, [place] in [place], has a history of bipolar disorder, PTSD, IBS, Barrett's esophagus, SVT was evaluated by telemedicine today.  Patient today reports that he continues to struggle with mood lability.  He reports the medication has not helped him much and he understand it is too early to say.  He however does report some side effects like nausea, bloating since the past few days.  He feels the GI symptoms could be getting better.  He has been compliant with his medication as prescribed.  Continues to struggle with lack of motivation, lethargy during the day.  Patient is on multiple medications like [name], Andrew [name] which could also be contributing to some of the symptoms that he is describing.  However we will give the medications more time.  Discussed with patient to take the medications at bedtime to see if that will help with some of his energy problems during the day.  Patient reports he is about to start a new  job soon and is excited.  Patient continues to be in psychotherapy sessions which are helpful.  Discussed with patient to have more frequent psychotherapy sessions to help him.  Since he is having side effects will not make any medication readjustment today.  Discussed with patient to take his Andrew [name] with food and give it some more time. Visit Diagnosis:    ICD-10-CM   1. Bipolar I disorder, most recent episode depressed (HCC) F31.30 Andrew ([name]) 80 MG TABS tablet  2. PTSD (post-traumatic stress disorder) F43.10   3. Cannabis abuse, episodic F12.10     Past Psychiatric History: Reviewed past psychiatric history from my progress note on 06/22/2018.  Past trials of Depakote, lithium, Geodon, Xanax, Adderall, Lexapro.  Past Medical History:  Past Medical History:  Diagnosis Date  . Barrett esophagus   . Bipolar 1 disorder, depressed (HCC)   . Cardiac arrhythmia due to congenital heart disease   . Depression   . Irritable bowel syndrome (IBS)   . SVT (supraventricular tachycardia) (HCC)     Past Surgical History:  Procedure Laterality Date  . ABLATION      Family Psychiatric History: Reviewed family psychiatric history from my progress note on 06/22/2018  Family History:  Family History  Problem Relation Age of Onset  . Alcohol abuse Mother   . Depression Mother   . Mental illness Mother   . Asthma Father   . Diabetes Father   . Hyperlipidemia Paternal Grandfather   . Heart attack Paternal Grandfather  Social History: Reviewed social history from my progress note on 06/22/2018. Social History   Socioeconomic History  . Marital status: Married    Spouse name: Not on file  . Number of children: Not on file  . Years of education: Not on file  . Highest education level: Not on file  Occupational History  . Not on file  Social Needs  . Financial resource strain: Not on file  . Food insecurity:    Worry: Not on file    Inability: Not on file  . Transportation  needs:    Medical: Not on file    Non-medical: Not on file  Tobacco Use  . Smoking status: Former Research scientist (life sciences)  . Smokeless tobacco: Never Used  Substance and Sexual Activity  . Alcohol use: Yes    Comment: ocassionally  . Drug use: Yes    Types: Marijuana    Comment: last smoked a few weeks ago  . Sexual activity: Yes  Lifestyle  . Physical activity:    Days per week: Not on file    Minutes per session: Not on file  . Stress: Not on file  Relationships  . Social connections:    Talks on phone: Not on file    Gets together: Not on file    Attends religious service: Not on file    Active member of club or organization: Not on file    Attends meetings of clubs or organizations: Not on file    Relationship status: Not on file  Other Topics Concern  . Not on file  Social History Narrative  . Not on file    Allergies:  Allergies  Allergen Reactions  . Beef-Derived Products Anaphylaxis  . Grapefruit Flavor [Flavoring Agent]   . Nsaids Itching    Metabolic Disorder Labs: Lab Results  Component Value Date   HGBA1C 4.8 06/28/2018   Lab Results  Component Value Date   PROLACTIN 5.1 06/28/2018   Lab Results  Component Value Date   CHOL 216 (H) 06/28/2018   [name] 124.0 06/28/2018   HDL 36.50 (L) 06/28/2018   CHOLHDL 6 06/28/2018   VLDL 24.8 06/28/2018   LDLCALC 155 (H) 06/28/2018   Lab Results  Component Value Date   TSH 1.77 03/30/2018    Therapeutic Level Labs: No results found for: LITHIUM No results found for: VALPROATE No components found for:  CBMZ  Current Medications: Current Outpatient Medications  Medication Sig Dispense Refill  . calcium-vitamin D (OSCAL WITH D) 500-200 MG-UNIT tablet Take 1 tablet by mouth.    . lamoTRIgine (Andrew [name]) 200 MG tablet Take 1 tablet (200 mg total) by mouth daily. 90 tablet 1  . LORazepam (ATIVAN) 1 MG tablet Take 1 tablet (1 mg total) by mouth every 8 (eight) hours as needed for anxiety. 90 tablet 1  . Andrew  ([name]) 20 MG TABS tablet Take 1 tablet (20 mg total) by mouth daily with supper. To be combined with 80 mg 90 tablet 0  . Andrew ([name]) 80 MG TABS tablet Take 1 tablet (80 mg total) by mouth daily with supper. To be combined with 80 mg 90 tablet 0  . Andrew ([name]) 80 MG TABS tablet Take 1 tablet (80 mg total) by mouth daily with supper. 90 tablet 1  . pantoprazole (PROTONIX) 40 MG tablet Take 1 tablet (40 mg total) by mouth daily. 90 tablet 1  . propranolol ER (INDERAL LA) 60 MG 24 hr capsule Take 1 capsule (60 mg total) by mouth daily. [place]  capsule 1  . sertraline (Andrew [name]) 25 MG tablet Take 1 tablet (25 mg total) by mouth daily with breakfast. 30 tablet 0   No current facility-administered medications for this visit.      Musculoskeletal: Strength & Muscle Tone: within normal limits Gait & Station: normal Patient leans: N/A  Psychiatric Specialty Exam: Review of Systems  Psychiatric/Behavioral: Positive for depression.  All other systems reviewed and are negative.   There were no vitals taken for this visit.There is no height or weight on file to calculate BMI.  General Appearance: Casual  Eye Contact:  Fair  Speech:  Clear and Coherent  Volume:  Normal  Mood:  Depressed  Affect:  Appropriate  Thought Process:  Goal Directed and Descriptions of Associations: Intact  Orientation:  Full (Time, Place, and Person)  Thought Content: Logical   Suicidal Thoughts:  No  Homicidal Thoughts:  No  Memory:  Immediate;   Fair Recent;   Fair Remote;   Fair  Judgement:  Fair  Insight:  Fair  Psychomotor Activity:  Normal  Concentration:  Concentration: Fair and Attention Span: Fair  Recall:  [employer]  Fund of Knowledge: Fair  Language: Fair  Akathisia:  No  Handed:  Right  AIMS (if indicated): Denies tremors, rigidity, stiffness  Assets:  Communication Skills Desire for Improvement Housing Social Support  ADL's:  Intact  Cognition: WNL  Sleep:  Fair    Screenings:   Assessment and Plan: Andrew [name] is a 28 year old male, currently going through divorce, employed, lives in [place], has a history of bipolar disorder, PTSD, IBS, SVT, Barrett's esophagus was evaluated by telemedicine today.  Patient is biologically predisposed given his family history.  He also has psychosocial stressors of going through divorce and COVID-19 outbreak.  Patient will continue to benefit from medication readjustment since he continues to struggle with mood symptoms. Plan Bipolar disorder-unstable [name] 100 mg p.o. daily with meals. Andrew [name] 200 mg p.o. daily. Andrew [name] 25 mg p.o. daily with breakfast.  PTSD-improving Continue CBT with therapist Andrew [name]. Andrew [name] 25 mg p.o. daily with breakfast  For cannabis abuse episodic-unstable Will monitor closely.  Will not make any medication readjustment today since he has some adverse side effects to the Andrew [name].  Discussed with patient to give the medication more time.  Follow-up in clinic in 3 to 4 weeks or sooner if needed.  July 7 at 4:45 PM  I have spent atleast 15 minutes non face to face with patient today. More than 50 % of the time was spent for psychoeducation and supportive psychotherapy and care coordination.  This note was generated in part or whole with voice recognition software. Voice recognition is usually quite accurate but there are transcription errors that can and very often do occur. I apologize for any typographical errors that were not detected and corrected.       Jomarie LongsSaramma Kalesha Irving, MD 07/05/2018, 6:04 PM

## 2018-07-06 ENCOUNTER — Telehealth: Payer: Self-pay

## 2018-07-06 NOTE — Telephone Encounter (Signed)
Copied from [place] [id number]. Topic: Referral - Request for Referral >> Jul 06, 2018  3:46 PM Yvette Rack wrote: Has patient seen PCP for this complaint? yes  *If NO, is insurance requiring patient see PCP for this issue before PCP can refer them? Referral for which specialty: Therapist Preferred provider/office: [facility]  ph# [phone] Reason for referral: family therapy

## 2018-07-07 ENCOUNTER — Other Ambulatory Visit: Payer: Self-pay

## 2018-07-07 ENCOUNTER — Other Ambulatory Visit: Payer: Self-pay | Admitting: *Deleted

## 2018-07-07 DIAGNOSIS — I471 Supraventricular tachycardia, unspecified: Secondary | ICD-10-CM

## 2018-07-07 NOTE — Telephone Encounter (Signed)
Pt alled [facility]  Has patient seen PCP for this complaint? yes  *If NO, is insurance requiring patient see PCP for this issue before PCP can refer them? Referral for which specialty: Therapist Preferred provider/office: Aure[facility] Jew  ph# [phone] Reason for referral: family therapy

## 2018-07-07 NOTE — Telephone Encounter (Signed)
Referral was placed 06/22/2018, it is under the referrals tab in his chart

## 2018-07-07 NOTE — Telephone Encounter (Signed)
Called Pt No answer Left a VM, that referral was made and  to call the office if he has any questions.

## 2018-07-08 NOTE — Telephone Encounter (Signed)
It was submitted to [employer].

## 2018-08-03 ENCOUNTER — Ambulatory Visit (INDEPENDENT_AMBULATORY_CARE_PROVIDER_SITE_OTHER): Payer: PRIVATE HEALTH INSURANCE | Admitting: Psychiatry

## 2018-08-03 ENCOUNTER — Encounter: Payer: Self-pay | Admitting: Psychiatry

## 2018-08-03 ENCOUNTER — Other Ambulatory Visit: Payer: Self-pay

## 2018-08-03 DIAGNOSIS — F4310 Post-traumatic stress disorder, unspecified: Secondary | ICD-10-CM

## 2018-08-03 DIAGNOSIS — F1210 Cannabis abuse, uncomplicated: Secondary | ICD-10-CM | POA: Diagnosis not present

## 2018-08-03 DIAGNOSIS — F3130 Bipolar disorder, current episode depressed, mild or moderate severity, unspecified: Secondary | ICD-10-CM

## 2018-08-03 MED ORDER — LURASIDONE HCL 80 MG PO TABS: 80.0000 mg | ORAL_TABLET | Freq: Every day | ORAL | 0 refills | Status: DC

## 2018-08-03 MED ORDER — VENLAFAXINE HCL ER 37.5 MG PO CP24: 37.5000 mg | ORAL_CAPSULE | Freq: Every day | ORAL | 1 refills | Status: DC

## 2018-08-03 NOTE — Progress Notes (Signed)
Virtual Visit via Video Note  I connected with Andrew [name] on 08/03/18 at  4:45 PM EDT by a video enabled telemedicine application and verified that I am speaking with the correct person using two identifiers.   I discussed the limitations of evaluation and management by telemedicine and the availability of in person appointments. The patient expressed understanding and agreed to proceed.   I discussed the assessment and treatment plan with the patient. The patient was provided an opportunity to ask questions and all were answered. The patient agreed with the plan and demonstrated an understanding of the instructions.   The patient was advised to call back or seek an in-person evaluation if the symptoms worsen or if the condition fails to improve as anticipated.  BH MD OP Progress Note  08/03/2018 5:56 PM Andrew [name]  MRN:  [id number]  Chief Complaint:  Chief Complaint    Follow-up     HPI: Andrew [name] is a 28 year old Caucasian male, currently going through a divorce, employed, lives in [place], has a history of bipolar disorder, PTSD, IBS, Barrett's esophagus, SVT was evaluated by telemedicine today.  Patient today reports he continues to struggle with anxiety and depressive symptoms.  He describes inability to focus, inability to sit still often and trouble with concentration.  He wonders whether he has ADHD.  He however has never been diagnosed with ADHD before.  Patient reports he did not tolerate the Zoloft well.  He reports here that he got some hives on the Zoloft and hence had to stop it.  Patient is compliant on Latuda and Lamictal.  He denies any side effects.  Patient denies any suicidality, homicidality or perceptual disturbances.  Patient denies any other concerns today.   Visit Diagnosis:    ICD-10-CM   1. Bipolar I disorder, most recent episode depressed (HCC)  F31.30 venlafaxine XR (EFFEXOR-XR) 37.5 MG 24 hr capsule    lurasidone (LATUDA) 80 MG TABS tablet  2. PTSD  (post-traumatic stress disorder)  F43.10 venlafaxine XR (EFFEXOR-XR) 37.5 MG 24 hr capsule  3. Cannabis abuse, episodic  F12.10     Past Psychiatric History: I have reviewed past psychiatric history from my progress note on 06/22/2018.  Past trials of Depakote, lithium, Geodon, Xanax, Adderall, Lexapro.  Past Medical History:  Past Medical History:  Diagnosis Date  . Barrett esophagus   . Bipolar 1 disorder, depressed (HCC)   . Cardiac arrhythmia due to congenital heart disease   . Depression   . Irritable bowel syndrome (IBS)   . SVT (supraventricular tachycardia) (HCC)     Past Surgical History:  Procedure Laterality Date  . ABLATION      Family Psychiatric History: I have reviewed family psychiatric history from my progress note on 06/22/2018.  Family History:  Family History  Problem Relation Age of Onset  . Alcohol abuse Mother   . Depression Mother   . Mental illness Mother   . Asthma Father   . Diabetes Father   . Hyperlipidemia Paternal Grandfather   . Heart attack Paternal Grandfather     Social History: I have reviewed social history from my progress note on 06/22/2018. Social History   Socioeconomic History  . Marital status: Married    Spouse name: Not on file  . Number of children: Not on file  . Years of education: Not on file  . Highest education level: Not on file  Occupational History  . Not on file  Social Needs  . Financial resource strain: Not on file  .  Food insecurity    Worry: Not on file    Inability: Not on file  . Transportation needs    Medical: Not on file    Non-medical: Not on file  Tobacco Use  . Smoking status: Former Research scientist (life sciences)  . Smokeless tobacco: Never Used  Substance and Sexual Activity  . Alcohol use: Yes    Comment: ocassionally  . Drug use: Yes    Types: Marijuana    Comment: last smoked a few weeks ago  . Sexual activity: Yes  Lifestyle  . Physical activity    Days per week: Not on file    Minutes per session: Not on  file  . Stress: Not on file  Relationships  . Social Herbalist on phone: Not on file    Gets together: Not on file    Attends religious service: Not on file    Active member of club or organization: Not on file    Attends meetings of clubs or organizations: Not on file    Relationship status: Not on file  Other Topics Concern  . Not on file  Social History Narrative  . Not on file    Allergies:  Allergies  Allergen Reactions  . Beef-Derived Products Anaphylaxis  . Grapefruit Flavor [Flavoring Agent]   . Nsaids Itching    Metabolic Disorder Labs: Lab Results  Component Value Date   HGBA1C 4.8 06/28/2018   Lab Results  Component Value Date   PROLACTIN 5.1 06/28/2018   Lab Results  Component Value Date   CHOL 216 (H) 06/28/2018   [name] 124.0 06/28/2018   HDL 36.50 (L) 06/28/2018   CHOLHDL 6 06/28/2018   VLDL 24.8 06/28/2018   LDLCALC 155 (H) 06/28/2018   Lab Results  Component Value Date   TSH 1.77 03/30/2018    Therapeutic Level Labs: No results found for: LITHIUM No results found for: VALPROATE No components found for:  CBMZ  Current Medications: Current Outpatient Medications  Medication Sig Dispense Refill  . calcium-vitamin D (OSCAL WITH D) 500-200 MG-UNIT tablet Take 1 tablet by mouth.    . lamoTRIgine (LAMICTAL) 200 MG tablet Take 1 tablet (200 mg total) by mouth daily. 90 tablet 1  . LORazepam (ATIVAN) 1 MG tablet Take 1 tablet (1 mg total) by mouth every 8 (eight) hours as needed for anxiety. 90 tablet 1  . lurasidone (LATUDA) 20 MG TABS tablet Take 1 tablet (20 mg total) by mouth daily with supper. To be combined with 80 mg 90 tablet 0  . lurasidone (LATUDA) 80 MG TABS tablet Take 1 tablet (80 mg total) by mouth daily with supper. 90 tablet 1  . lurasidone (LATUDA) 80 MG TABS tablet Take 1 tablet (80 mg total) by mouth daily with supper. To be combined with 20 mg 90 tablet 0  . pantoprazole (PROTONIX) 40 MG tablet Take 1 tablet (40 mg  total) by mouth daily. 90 tablet 1  . propranolol ER (INDERAL LA) 60 MG 24 hr capsule Take 1 capsule (60 mg total) by mouth daily. 90 capsule 1  . venlafaxine XR (EFFEXOR-XR) 37.5 MG 24 hr capsule Take 1 capsule (37.5 mg total) by mouth daily with breakfast. 30 capsule 1   No current facility-administered medications for this visit.      Musculoskeletal: Strength & Muscle Tone: within normal limits Gait & Station: normal Patient leans: N/A  Psychiatric Specialty Exam: Review of Systems  Psychiatric/Behavioral: Positive for depression. The patient is nervous/anxious.   All other  systems reviewed and are negative.   There were no vitals taken for this visit.There is no height or weight on file to calculate BMI.  General Appearance: Casual  Eye Contact:  Fair  Speech:  Clear and Coherent  Volume:  Normal  Mood:  Anxious and Depressed  Affect:  Appropriate  Thought Process:  Goal Directed and Descriptions of Associations: Intact  Orientation:  Full (Time, Place, and Person)  Thought Content: Logical   Suicidal Thoughts:  No  Homicidal Thoughts:  No  Memory:  Immediate;   Fair Recent;   Fair Remote;   Fair  Judgement:  Fair  Insight:  Fair  Psychomotor Activity:  Normal  Concentration:  Concentration: Fair and Attention Span: Fair  Recall:  [employer] of Knowledge: Fair  Language: Fair  Akathisia:  No  Handed:  Right  AIMS (if indicated): denies tremors, rigidity  Assets:  Communication Skills Desire for Improvement Social Support  ADL's:  Intact  Cognition: WNL  Sleep:  Fair   Screenings:   Assessment and Plan: Mcallister is a 28 year old male, currently going through divorce, employed, lives in [place], has a history of bipolar disorder, PTSD, IBS, SVT,Barrett's esophagus  was evaluated by telemedicine today.  Patient is biologically predisposed given his family history.  He he also has psychosocial stressors of going through divorce and COVID-19 outbreak.  Patient  continues to struggle with anxiety, depression and inability to focus.  Plan For bipolar disorder-unstable Latuda 100 mg p.o. daily with meals Lamictal 200 mg p.o. daily Discontinue Zoloft. Start Effexor 37.5 mg p.o. daily.  For PTSD-improving Continue CBT with therapist Kathrin Greathouse Effexor 37.5 mg p.o. daily  For cannabis abuse episodic-unstable We will continue to monitor closely.  Follow-up in clinic in 1 month or sooner if needed.  August 17 at 2:45 PM  I have spent atleast 15 minutes non face to face with patient today. More than 50 % of the time was spent for psychoeducation and supportive psychotherapy and care coordination.  This note was generated in part or whole with voice recognition software. Voice recognition is usually quite accurate but there are transcription errors that can and very often do occur. I apologize for any typographical errors that were not detected and corrected.           Andrew Longs, MD 08/03/2018, 5:56 PM

## 2018-08-09 ENCOUNTER — Telehealth: Payer: Self-pay | Admitting: *Deleted

## 2018-08-09 NOTE — Telephone Encounter (Signed)
Left voicemail message to call back for results.  

## 2018-08-09 NOTE — Telephone Encounter (Signed)
-----   Message from Minna Merritts, MD sent at 08/01/2018 10:54 AM EDT ----- Monitor results shows PACs No SVT run documented over the 2-week. An effort to capture the tachycardia/SVT, does he want to wear second event monitor?

## 2018-08-16 NOTE — Telephone Encounter (Signed)
I looked under Pt referrals and I dont see a referral on 03/09/2018 with [facility].

## 2018-08-16 NOTE — Telephone Encounter (Signed)
Called Pt he said he went to that doctor on his own before he was a Pt here. Pt said he needs it for insurance purpose, because his insurance is charging him $600.00 without a referral from a provider

## 2018-08-16 NOTE — Telephone Encounter (Signed)
Referral is in epic --- please re-send it

## 2018-08-16 NOTE — Telephone Encounter (Signed)
Pt is calling and he needs the referral to be fax to his [employer] number [phone]. Phone number [phone]. [facility]. 03-09-2018 was appt date

## 2018-08-17 NOTE — Telephone Encounter (Signed)
Reviewed results and recommendations with patient and he declined to wear another monitor at this time. He reports that monitor was bothersome and did not stick well. Advised that if his fast heart rates should increase or become more consistent he could certainly call us back and we could try additional monitor at that time. He was agreeable with this and had no further questions at this time.

## 2018-08-17 NOTE — Telephone Encounter (Signed)
Left voicemail message to call back for results.  

## 2018-08-26 ENCOUNTER — Other Ambulatory Visit: Payer: Self-pay

## 2018-08-26 ENCOUNTER — Ambulatory Visit (INDEPENDENT_AMBULATORY_CARE_PROVIDER_SITE_OTHER): Admitting: Family Medicine

## 2018-08-26 DIAGNOSIS — K219 Gastro-esophageal reflux disease without esophagitis: Secondary | ICD-10-CM

## 2018-08-26 DIAGNOSIS — F319 Bipolar disorder, unspecified: Secondary | ICD-10-CM

## 2018-08-26 DIAGNOSIS — I471 Supraventricular tachycardia: Secondary | ICD-10-CM

## 2018-08-26 NOTE — Progress Notes (Signed)
Patient ID: Andrew [name], male   DOB: April 28, 1990, 28 y.o.   MRN: [id number]    Virtual Visit via video Note  This visit type was conducted due to national recommendations for restrictions regarding the COVID-19 pandemic (e.g. social distancing).  This format is felt to be most appropriate for this patient at this time.  All issues noted in this document were discussed and addressed.  No physical exam was performed (except for noted visual exam findings with Video Visits).   I connected with Herby Abraham today at  1:00 PM EDT by a video enabled telemedicine application and verified that I am speaking with the correct person using two identifiers. Location patient: home Location provider: work or home office Persons participating in the virtual visit: patient, provider  I discussed the limitations, risks, security and privacy concerns of performing an evaluation and management service by telephone and the availability of in person appointments. I also discussed with the patient that there may be a patient responsible charge related to this service. The patient expressed understanding and agreed to proceed.  HPI:  Patient and I connected via video to follow-up on mood, SVT, GERD.  Overall he is feeling well.  States psychiatry tried him on Zoloft for a bit, but he did not tolerate the medicine well and has stopped it.  He continues on [place] and Lamictal and overall feels well. I was able to review psychiatry note in epic.   Denies any issues with chest pain, palpitations, shortness of breath, GI upset, lower extremity swelling or feelings of weakness.  Denies any SI or HI.  He is on beta-blocker also to help control SVT.  Feels well on this dose.  Energy level is at baseline.  He does follow with cardiology as well for SVT monitoring.  Feels GERD well controlled with protonix. Does not have to take every day. No vomiting or diarrhea.   He is enjoying [place] since moving here.  He  is started to work in [employer], this is been going well and he is able to do a lot of work from home which has been really nice.  ROS: See pertinent positives and negatives per HPI.  Past Medical History:  Diagnosis Date  . Barrett esophagus   . Bipolar 1 disorder, depressed ([place])   . Cardiac arrhythmia due to congenital heart disease   . Depression   . Irritable bowel syndrome (IBS)   . SVT (supraventricular tachycardia) (HCC)     Past Surgical History:  Procedure Laterality Date  . ABLATION      Family History  Problem Relation Age of Onset  . Alcohol abuse Mother   . Depression Mother   . Mental illness Mother   . Asthma Father   . Diabetes Father   . Hyperlipidemia Paternal Grandfather   . Heart attack Paternal Grandfather    Social History   Tobacco Use  . Smoking status: Former Research scientist (life sciences)  . Smokeless tobacco: Never Used  Substance Use Topics  . Alcohol use: Yes    Comment: ocassionally    Current Outpatient Medications:  .  calcium-vitamin D (OSCAL WITH D) 500-200 MG-UNIT tablet, Take 1 tablet by mouth., Disp: , Rfl:  .  lamoTRIgine (LAMICTAL) 200 MG tablet, Take 1 tablet (200 mg total) by mouth daily., Disp: 90 tablet, Rfl: 1 .  LORazepam (ATIVAN) 1 MG tablet, Take 1 tablet (1 mg total) by mouth every 8 (eight) hours as needed for anxiety., Disp: 90 tablet, Rfl: 1 .  lurasidone (LATUDA) 20 MG TABS tablet, Take 1 tablet (20 mg total) by mouth daily with supper. To be combined with 80 mg, Disp: 90 tablet, Rfl: 0 .  lurasidone (LATUDA) 80 MG TABS tablet, Take 1 tablet (80 mg total) by mouth daily with supper., Disp: 90 tablet, Rfl: 1 .  lurasidone (LATUDA) 80 MG TABS tablet, Take 1 tablet (80 mg total) by mouth daily with supper. To be combined with 20 mg, Disp: 90 tablet, Rfl: 0 .  pantoprazole (PROTONIX) 40 MG tablet, Take 1 tablet (40 mg total) by mouth daily., Disp: 90 tablet, Rfl: 1 .  propranolol ER (INDERAL LA) 60 MG 24 hr capsule, Take 1 capsule (60  mg total) by mouth daily., Disp: 90 capsule, Rfl: 1 .  venlafaxine XR (EFFEXOR-XR) 37.5 MG 24 hr capsule, Take 1 capsule (37.5 mg total) by mouth daily with breakfast., Disp: 30 capsule, Rfl: 1  EXAM:  GENERAL: alert, oriented, appears well and in no acute distress  HEENT: atraumatic, conjunttiva clear, no obvious abnormalities on inspection of external nose and ears  NECK: normal movements of the head and neck  LUNGS: on inspection no signs of respiratory distress, breathing rate appears normal, no obvious gross SOB, gasping or wheezing  CV: no obvious cyanosis  MS: moves all visible extremities without noticeable abnormality  PSYCH/NEURO: pleasant and cooperative, no obvious depression or anxiety, speech and thought processing grossly intact  ASSESSMENT AND PLAN:  Discussed the following assessment and plan:  Bipolar disorder-patient follows regularly with psychiatry approximately every 1 month.  Mood appears stable on current medication regimen at this time.  Seems to be in good spirits and is enjoying his new job.  SVT- heart rate has been stable using propranolol.  He follows regularly with cardiology every 3 to 6 months.  GERD- well controlled with Protonix.  Does not take every day.  Tries to avoid foods that are too spicy or too greasy as they seem to set off GERD symptoms.   I discussed the assessment and treatment plan with the patient. The patient was provided an opportunity to ask questions and all were answered. The patient agreed with the plan and demonstrated an understanding of the instructions.   The patient was advised to call back or seek an in-person evaluation if the symptoms worsen or if the condition fails to improve as anticipated.  We will plan to follow-up in approximately 6 months for recheck on chronic medical conditions.  He is aware he can call clinic anytime with questions or concerns.  He will keep regularly scheduled follow-ups with psychiatry and  cardiology.   Tracey Harries, FNP

## 2018-08-27 ENCOUNTER — Telehealth: Payer: Self-pay | Admitting: Family Medicine

## 2018-08-27 NOTE — Telephone Encounter (Signed)
Please schedule 6 month follow up for patient  Will plan to be in office so we can get labs that day too  Thanks!  LG

## 2018-09-14 ENCOUNTER — Ambulatory Visit (INDEPENDENT_AMBULATORY_CARE_PROVIDER_SITE_OTHER): Admitting: Psychiatry

## 2018-09-14 ENCOUNTER — Encounter: Payer: Self-pay | Admitting: Psychiatry

## 2018-09-14 ENCOUNTER — Other Ambulatory Visit: Payer: Self-pay

## 2018-09-14 DIAGNOSIS — F3130 Bipolar disorder, current episode depressed, mild or moderate severity, unspecified: Secondary | ICD-10-CM

## 2018-09-14 DIAGNOSIS — F1210 Cannabis abuse, uncomplicated: Secondary | ICD-10-CM | POA: Diagnosis not present

## 2018-09-14 DIAGNOSIS — F4310 Post-traumatic stress disorder, unspecified: Secondary | ICD-10-CM | POA: Diagnosis not present

## 2018-09-14 MED ORDER — LURASIDONE HCL 80 MG PO TABS: 80.0000 mg | ORAL_TABLET | Freq: Every day | ORAL | 0 refills | Status: DC

## 2018-09-14 MED ORDER — VENLAFAXINE HCL ER 37.5 MG PO CP24: 37.5000 mg | ORAL_CAPSULE | Freq: Every day | ORAL | 0 refills | Status: DC

## 2018-09-14 MED ORDER — LURASIDONE HCL 20 MG PO TABS: 20.0000 mg | ORAL_TABLET | Freq: Every day | ORAL | 0 refills | Status: DC

## 2018-09-14 MED ORDER — LAMOTRIGINE 200 MG PO TABS: 200.0000 mg | ORAL_TABLET | Freq: Every day | ORAL | 0 refills | Status: DC

## 2018-09-14 NOTE — Progress Notes (Signed)
Virtual Visit via Video Note  I connected with Andrew [name] on 09/14/18 at  4:15 PM EDT by a video enabled telemedicine application and verified that I am speaking with the correct person using two identifiers.   I discussed the limitations of evaluation and management by telemedicine and the availability of in person appointments. The patient expressed understanding and agreed to proceed.   I discussed the assessment and treatment plan with the patient. The patient was provided an opportunity to ask questions and all were answered. The patient agreed with the plan and demonstrated an understanding of the instructions.   The patient was advised to call back or seek an in-person evaluation if the symptoms worsen or if the condition fails to improve as anticipated.  BH MD OP Progress Note  09/14/2018 5:37 PM Andrew [name]  MRN:  [id number]  Chief Complaint:  Chief Complaint    Follow-up     HPI: Andrew [name] is a 28 year old Caucasian male, currently going through a divorce, employed, lives in [place], has a history of bipolar disorder, PTSD, IBS, Barrett's esophagus, SVT was evaluated by telemedicine today.  Patient today reports he is currently doing well on the current medication regimen.  He reports the Effexor is helpful.  He denies any side effects.  He continues to be compliant on Latuda and Lamictal.  Denies any significant mood lability.  He reports sleep is good.  He reports he has started taking a diet pill- Thermofight -which has caffeine in it.  He reports that also seems to be helpful.  Discussed with patient to make sure he talks to his primary care provider before continuing this medication.  He agrees with plan.  He denies any other concerns today.   Visit Diagnosis:    ICD-10-CM   1. Bipolar I disorder, most recent episode depressed (HCC)  F31.30 venlafaxine XR (EFFEXOR-XR) 37.5 MG 24 hr capsule    lurasidone (LATUDA) 80 MG TABS tablet    lurasidone (LATUDA) 20 MG TABS  tablet  2. PTSD (post-traumatic stress disorder)  F43.10 venlafaxine XR (EFFEXOR-XR) 37.5 MG 24 hr capsule  3. Cannabis abuse, episodic use  F12.10 lamoTRIgine (LAMICTAL) 200 MG tablet    Past Psychiatric History: I have reviewed past psychiatric history from my progress note on 06/22/2018.  Past trials of Depakote, lithium, Geodon, Xanax, Adderall, Lexapro  Past Medical History:  Past Medical History:  Diagnosis Date  . Barrett esophagus   . Bipolar 1 disorder, depressed (HCC)   . Cardiac arrhythmia due to congenital heart disease   . Depression   . Irritable bowel syndrome (IBS)   . SVT (supraventricular tachycardia) (HCC)     Past Surgical History:  Procedure Laterality Date  . ABLATION      Family Psychiatric History: I have reviewed family psychiatric history from my progress note on 06/22/2018.  Family History:  Family History  Problem Relation Age of Onset  . Alcohol abuse Mother   . Depression Mother   . Mental illness Mother   . Asthma Father   . Diabetes Father   . Hyperlipidemia Paternal Grandfather   . Heart attack Paternal Grandfather     Social History: I have reviewed social history from my progress note on 06/22/2018. Social History   Socioeconomic History  . Marital status: Married    Spouse name: Not on file  . Number of children: Not on file  . Years of education: Not on file  . Highest education level: Not on file  Occupational History  .  Not on file  Social Needs  . Financial resource strain: Not on file  . Food insecurity    Worry: Not on file    Inability: Not on file  . Transportation needs    Medical: Not on file    Non-medical: Not on file  Tobacco Use  . Smoking status: Former Games developermoker  . Smokeless tobacco: Never Used  Substance and Sexual Activity  . Alcohol use: Yes    Comment: ocassionally  . Drug use: Yes    Types: Marijuana    Comment: last smoked a few weeks ago  . Sexual activity: Yes  Lifestyle  . Physical activity     Days per week: Not on file    Minutes per session: Not on file  . Stress: Not on file  Relationships  . Social Musicianconnections    Talks on phone: Not on file    Gets together: Not on file    Attends religious service: Not on file    Active member of club or organization: Not on file    Attends meetings of clubs or organizations: Not on file    Relationship status: Not on file  Other Topics Concern  . Not on file  Social History Narrative  . Not on file    Allergies:  Allergies  Allergen Reactions  . Beef-Derived Products Anaphylaxis  . Grapefruit Flavor [Flavoring Agent]   . Nsaids Itching    Metabolic Disorder Labs: Lab Results  Component Value Date   HGBA1C 4.8 06/28/2018   Lab Results  Component Value Date   PROLACTIN 5.1 06/28/2018   Lab Results  Component Value Date   CHOL 216 (H) 06/28/2018   [name] 124.0 06/28/2018   HDL 36.50 (L) 06/28/2018   CHOLHDL 6 06/28/2018   VLDL 24.8 06/28/2018   LDLCALC 155 (H) 06/28/2018   Lab Results  Component Value Date   TSH 1.77 03/30/2018    Therapeutic Level Labs: No results found for: LITHIUM No results found for: VALPROATE No components found for:  CBMZ  Current Medications: Current Outpatient Medications  Medication Sig Dispense Refill  . diclofenac sodium (VOLTAREN) 1 % GEL Apply topically.    . calcium-vitamin D (OSCAL WITH D) 500-200 MG-UNIT tablet Take 1 tablet by mouth.    . lamoTRIgine (LAMICTAL) 200 MG tablet Take 1 tablet (200 mg total) by mouth daily. 90 tablet 0  . LORazepam (ATIVAN) 1 MG tablet Take 1 tablet (1 mg total) by mouth every 8 (eight) hours as needed for anxiety. 90 tablet 1  . lurasidone (LATUDA) 20 MG TABS tablet Take 1 tablet (20 mg total) by mouth daily with supper. To be combined with 80 mg 90 tablet 0  . lurasidone (LATUDA) 80 MG TABS tablet Take 1 tablet (80 mg total) by mouth daily with supper. To be combined with 20 mg 90 tablet 0  . pantoprazole (PROTONIX) 40 MG tablet Take 1 tablet  (40 mg total) by mouth daily. 90 tablet 1  . propranolol ER (INDERAL LA) 60 MG 24 hr capsule Take 1 capsule (60 mg total) by mouth daily. 90 capsule 1  . venlafaxine XR (EFFEXOR-XR) 37.5 MG 24 hr capsule Take 1 capsule (37.5 mg total) by mouth daily with breakfast. 90 capsule 0   No current facility-administered medications for this visit.      Musculoskeletal: Strength & Muscle Tone: UTA Gait & Station: normal Patient leans: N/A  Psychiatric Specialty Exam: Review of Systems  Psychiatric/Behavioral: The patient is nervous/anxious.   All  other systems reviewed and are negative.   There were no vitals taken for this visit.There is no height or weight on file to calculate BMI.  General Appearance: Casual  Eye Contact:  Fair  Speech:  Clear and Coherent  Volume:  Normal  Mood:  Anxious  Affect:  Congruent  Thought Process:  Goal Directed and Descriptions of Associations: Intact  Orientation:  Full (Time, Place, and Person)  Thought Content: Logical   Suicidal Thoughts:  No  Homicidal Thoughts:  No  Memory:  Immediate;   Fair Recent;   Fair Remote;   Fair  Judgement:  Fair  Insight:  Fair  Psychomotor Activity:  Normal  Concentration:  Concentration: Fair and Attention Span: Fair  Recall:  [employer] of Knowledge: Fair  Language: Fair  Akathisia:  No  Handed:  Right  AIMS (if indicated): Denies tremors, rigidity  Assets:  Communication Skills Desire for Improvement Social Support  ADL's:  Intact  Cognition: WNL  Sleep:  Fair   Screenings: PHQ2-9     Office Visit from 08/26/2018 in [facility]  PHQ-2 Total Score  0  PHQ-9 Total Score  0       Assessment and Plan: Vue is a 28 year old male, currently going through divorce, employed, lives in [place], has a history of bipolar disorder, PTSD, IBS, SVT, Barrett's esophagus was evaluated by telemedicine today.  Patient is biologically predisposed given his family history.  He also has  psychosocial stressors of going through divorce and COVID-19 outbreak.  Patient is currently making progress on the current medication regimen.  Plan as noted below.  Plan Bipolar disorder- improving Latuda 100 mg p.o. daily with meals Lamictal 200 mg p.o. daily Effexor 37.5 mg p.o. daily  For PTSD-improving Continue CBT with therapist Ann Maki Effexor 37.5 mg p.o. daily  For cannabis use disorder-episodic- Patient will continue to work on it.  Follow-up in clinic in 1 month or sooner if needed.November 3 rd , 2 pm  I have spent atleast 15 minutes non face to face with patient today. More than 50 % of the time was spent for psychoeducation and supportive psychotherapy and care coordination. This note was generated in part or whole with voice recognition software. Voice recognition is usually quite accurate but there are transcription errors that can and very often do occur. I apologize for any typographical errors that were not detected and corrected.       Ursula Alert, MD 09/14/2018, 5:37 PM

## 2018-10-12 ENCOUNTER — Telehealth: Payer: Self-pay | Admitting: Psychiatry

## 2018-10-12 ENCOUNTER — Telehealth: Payer: Self-pay

## 2018-10-12 DIAGNOSIS — F3130 Bipolar disorder, current episode depressed, mild or moderate severity, unspecified: Secondary | ICD-10-CM

## 2018-10-12 DIAGNOSIS — F1210 Cannabis abuse, uncomplicated: Secondary | ICD-10-CM

## 2018-10-12 DIAGNOSIS — F4310 Post-traumatic stress disorder, unspecified: Secondary | ICD-10-CM

## 2018-10-12 MED ORDER — LURASIDONE HCL 20 MG PO TABS: 20.0000 mg | ORAL_TABLET | Freq: Every day | ORAL | 0 refills | Status: DC

## 2018-10-12 MED ORDER — LAMOTRIGINE 200 MG PO TABS: 200.0000 mg | ORAL_TABLET | Freq: Every day | ORAL | 0 refills | Status: DC

## 2018-10-12 MED ORDER — VENLAFAXINE HCL ER 37.5 MG PO CP24: 37.5000 mg | ORAL_CAPSULE | Freq: Every day | ORAL | 0 refills | Status: DC

## 2018-10-12 MED ORDER — LURASIDONE HCL 80 MG PO TABS: 80.0000 mg | ORAL_TABLET | Freq: Every day | ORAL | 0 refills | Status: DC

## 2018-10-12 NOTE — Telephone Encounter (Signed)
pt called states she needs refills [facility]

## 2018-10-12 NOTE — Telephone Encounter (Signed)
I have sent all medications-Lamictal, Latuda, Effexor to [facility] home delivery.

## 2018-10-15 ENCOUNTER — Other Ambulatory Visit: Payer: Self-pay | Admitting: Lab

## 2018-10-15 DIAGNOSIS — F32A Depression, unspecified: Secondary | ICD-10-CM

## 2018-10-15 DIAGNOSIS — K219 Gastro-esophageal reflux disease without esophagitis: Secondary | ICD-10-CM

## 2018-10-15 DIAGNOSIS — F329 Major depressive disorder, single episode, unspecified: Secondary | ICD-10-CM

## 2018-10-15 MED ORDER — PROPRANOLOL HCL ER 60 MG PO CP24: 60.0000 mg | ORAL_CAPSULE | Freq: Every day | ORAL | 1 refills | Status: AC

## 2018-10-15 MED ORDER — PANTOPRAZOLE SODIUM 40 MG PO TBEC: 40.0000 mg | DELAYED_RELEASE_TABLET | Freq: Every day | ORAL | 1 refills | Status: AC

## 2018-11-30 ENCOUNTER — Encounter: Payer: Self-pay | Admitting: Psychiatry

## 2018-11-30 ENCOUNTER — Other Ambulatory Visit: Payer: Self-pay

## 2018-11-30 ENCOUNTER — Ambulatory Visit (INDEPENDENT_AMBULATORY_CARE_PROVIDER_SITE_OTHER): Admitting: Psychiatry

## 2018-11-30 DIAGNOSIS — F4310 Post-traumatic stress disorder, unspecified: Secondary | ICD-10-CM

## 2018-11-30 DIAGNOSIS — F3176 Bipolar disorder, in full remission, most recent episode depressed: Secondary | ICD-10-CM

## 2018-11-30 DIAGNOSIS — F1210 Cannabis abuse, uncomplicated: Secondary | ICD-10-CM

## 2018-11-30 MED ORDER — LAMOTRIGINE 200 MG PO TABS: 200.0000 mg | ORAL_TABLET | Freq: Every day | ORAL | 1 refills | Status: AC

## 2018-11-30 MED ORDER — LURASIDONE HCL 80 MG PO TABS: 80.0000 mg | ORAL_TABLET | Freq: Every day | ORAL | 1 refills | Status: AC

## 2018-11-30 MED ORDER — LURASIDONE HCL 20 MG PO TABS: 20.0000 mg | ORAL_TABLET | Freq: Every day | ORAL | 1 refills | Status: AC

## 2018-11-30 MED ORDER — VENLAFAXINE HCL ER 37.5 MG PO CP24: 37.5000 mg | ORAL_CAPSULE | Freq: Every day | ORAL | 1 refills | Status: AC

## 2018-11-30 NOTE — Progress Notes (Signed)
Virtual Visit via Video Note  I connected with Andrew [name] on 11/30/18 at  2:00 PM EST by a video enabled telemedicine application and verified that I am speaking with the correct person using two identifiers.   I discussed the limitations of evaluation and management by telemedicine and the availability of in person appointments. The patient expressed understanding and agreed to proceed.    I discussed the assessment and treatment plan with the patient. The patient was provided an opportunity to ask questions and all were answered. The patient agreed with the plan and demonstrated an understanding of the instructions.   The patient was advised to call back or seek an in-person evaluation if the symptoms worsen or if the condition fails to improve as anticipated.   BH MD OP Progress Note  11/30/2018 2:13 PM Talor Cheema  MRN:  [id number]  Chief Complaint:  Chief Complaint    Follow-up     HPI: Floyd is a 28 year old Caucasian male, currently going through a divorce, employed, lives in [place], has a history of bipolar disorder, PTSD, IBS, Barrett's esophagus, SVT was evaluated by telemedicine today.  Patient today reports he is currently doing well with regards to his mood symptoms.  He denies any significant depression or anxiety symptoms at this time.  He reports sleep is good.  He is compliant on the Latuda and the Lamictal as prescribed.  He denies any side effects to the medications.  Patient however reports he continues to struggle with some attention and focus problems.  There has been times when he zones out.  He struggles with motivation when taking a projects.  He gets bored to frequently.  Discussed with patient about referral for ADHD testing.  Patient however reports he wants to wait since he is relocating to [place] soon.  He reports he will have this discussion with his new provider once he was there.  Patient continues to use cannabis and currently uses it  once every 2 days.  Provided substance abuse counseling.  Patient denies any other concerns today.  He continues to work with his therapist and reports therapy sessions as helpful. Visit Diagnosis:    ICD-10-CM   1. Bipolar disorder, in full remission, most recent episode depressed (HCC)  F31.76 lurasidone (LATUDA) 20 MG TABS tablet    lurasidone (LATUDA) 80 MG TABS tablet    venlafaxine XR (EFFEXOR-XR) 37.5 MG 24 hr capsule   stable  2. PTSD (post-traumatic stress disorder)  F43.10    STABLE  3. Cannabis use disorder, mild, abuse  F12.10 lamoTRIgine (LAMICTAL) 200 MG tablet    Past Psychiatric History: I have reviewed past psychiatric history from my progress note on 06/22/2018.  Past trials of Depakote, Geodon, lithium, Xanax, Adderall, Lexapro  Past Medical History:  Past Medical History:  Diagnosis Date  . Barrett esophagus   . Bipolar 1 disorder, depressed (HCC)   . Cardiac arrhythmia due to congenital heart disease   . Depression   . Irritable bowel syndrome (IBS)   . SVT (supraventricular tachycardia) (HCC)     Past Surgical History:  Procedure Laterality Date  . ABLATION      Family Psychiatric History: I have reviewed family psychiatric history from my progress note on 06/22/2018.  Family History:  Family History  Problem Relation Age of Onset  . Alcohol abuse Mother   . Depression Mother   . Mental illness Mother   . Asthma Father   . Diabetes Father   . Hyperlipidemia Paternal  Grandfather   . Heart attack Paternal Grandfather     Social History: Reviewed social history from my progress note on 06/22/2018. Social History   Socioeconomic History  . Marital status: Married    Spouse name: Not on file  . Number of children: Not on file  . Years of education: Not on file  . Highest education level: Not on file  Occupational History  . Not on file  Social Needs  . Financial resource strain: Not on file  . Food insecurity    Worry: Not on file     Inability: Not on file  . Transportation needs    Medical: Not on file    Non-medical: Not on file  Tobacco Use  . Smoking status: Former Games developer  . Smokeless tobacco: Never Used  Substance and Sexual Activity  . Alcohol use: Yes    Comment: ocassionally  . Drug use: Yes    Types: Marijuana    Comment: last smoked a few weeks ago  . Sexual activity: Yes  Lifestyle  . Physical activity    Days per week: Not on file    Minutes per session: Not on file  . Stress: Not on file  Relationships  . Social Musician on phone: Not on file    Gets together: Not on file    Attends religious service: Not on file    Active member of club or organization: Not on file    Attends meetings of clubs or organizations: Not on file    Relationship status: Not on file  Other Topics Concern  . Not on file  Social History Narrative  . Not on file    Allergies:  Allergies  Allergen Reactions  . Beef-Derived Products Anaphylaxis  . Pork Allergy Anaphylaxis  . Grapefruit Flavor [Flavoring Agent]   . Ibuprofen Nausea And Vomiting  . Bee Pollen Rash  . Nsaids Itching, Nausea Only and Rash    Metabolic Disorder Labs: Lab Results  Component Value Date   HGBA1C 4.8 06/28/2018   Lab Results  Component Value Date   PROLACTIN 5.1 06/28/2018   Lab Results  Component Value Date   CHOL 216 (H) 06/28/2018   [name] 124.0 06/28/2018   HDL 36.50 (L) 06/28/2018   CHOLHDL 6 06/28/2018   VLDL 24.8 06/28/2018   LDLCALC 155 (H) 06/28/2018   Lab Results  Component Value Date   TSH 1.77 03/30/2018    Therapeutic Level Labs: No results found for: LITHIUM No results found for: VALPROATE No components found for:  CBMZ  Current Medications: Current Outpatient Medications  Medication Sig Dispense Refill  . calcium-vitamin D (OSCAL WITH D) 500-200 MG-UNIT tablet Take 1 tablet by mouth.    . diclofenac sodium (VOLTAREN) 1 % GEL Apply topically.    . lamoTRIgine (LAMICTAL) 200 MG tablet  Take 1 tablet (200 mg total) by mouth daily. 90 tablet 1  . LORazepam (ATIVAN) 1 MG tablet Take 1 tablet (1 mg total) by mouth every 8 (eight) hours as needed for anxiety. 90 tablet 1  . lurasidone (LATUDA) 20 MG TABS tablet Take 1 tablet (20 mg total) by mouth daily with supper. To be combined with 80 mg 90 tablet 1  . lurasidone (LATUDA) 80 MG TABS tablet Take 1 tablet (80 mg total) by mouth daily with supper. To be combined with 20 mg 90 tablet 1  . pantoprazole (PROTONIX) 40 MG tablet Take 1 tablet (40 mg total) by mouth daily. 90 tablet  1  . propranolol ER (INDERAL LA) 60 MG 24 hr capsule Take 1 capsule (60 mg total) by mouth daily. 90 capsule 1  . venlafaxine XR (EFFEXOR-XR) 37.5 MG 24 hr capsule Take 1 capsule (37.5 mg total) by mouth daily with breakfast. 90 capsule 1   No current facility-administered medications for this visit.      Musculoskeletal: Strength & Muscle Tone: UTA Gait & Station: WNL Patient leans: N/A  Psychiatric Specialty Exam: Review of Systems  Psychiatric/Behavioral: Negative for depression, hallucinations, substance abuse and suicidal ideas. The patient is not nervous/anxious and does not have insomnia.   All other systems reviewed and are negative.   There were no vitals taken for this visit.There is no height or weight on file to calculate BMI.  General Appearance: Casual  Eye Contact:  Fair  Speech:  Normal Rate  Volume:  Normal  Mood:  Euthymic  Affect:  Congruent  Thought Process:  Goal Directed and Descriptions of Associations: Intact  Orientation:  Full (Time, Place, and Person)  Thought Content: Logical   Suicidal Thoughts:  No  Homicidal Thoughts:  No  Memory:  Immediate;   Fair Recent;   Fair Remote;   Fair  Judgement:  Fair  Insight:  Fair  Psychomotor Activity:  Normal  Concentration:  Concentration: Fair and Attention Span: Fair  Recall:  [employer] of Knowledge: Fair  Language: Fair  Akathisia:  No  Handed:  Right  AIMS (if  indicated): denies tremors, rigidity  Assets:  Communication Skills Desire for Improvement Housing Social Support  ADL's:  Intact  Cognition: WNL  Sleep:  Fair   Screenings: PHQ2-9     Office Visit from 08/26/2018 in [place]  PHQ-2 Total Score  0  PHQ-9 Total Score  0       Assessment and Plan: Devonta is a 28 year old male, currently going through divorce, employed, lives in [place], has a history of bipolar disorder, PTSD, IBS, SVT, Barrett's esophagus was evaluated by telemedicine today.  Patient is biologically predisposed given his family history.  He also has psychosocial stressors of going through a divorce and COVID-19 outbreak.  Patient is currently making progress on the current medication regimen. Patient does struggle with continued attention problems and may benefit from ADHD testing .Plan as noted below.  Plan Bipolar disorder-in remission Latuda 100 mg p.o. daily with meals Lamictal 200 mg p.o. daily Effexor 37.5 mg p.o. daily  PTSD-improving Continue CBT with therapist Corliss Marcus Effexor as prescribed  Cannabis use disorder-mild Provided substance abuse counseling  Patient with attention and focus problems may benefit from ADHD testing.  Patient reports he will have this discussion with his new provider.  Follow-up in clinic as needed.  I have spent atleast 15 minutes non face to face with patient today. More than 50 % of the time was spent for psychoeducation and supportive psychotherapy and care coordination. This note was generated in part or whole with voice recognition software. Voice recognition is usually quite accurate but there are transcription errors that can and very often do occur. I apologize for any typographical errors that were not detected and corrected.       Ursula Alert, MD 11/30/2018, 2:13 PM

## 2019-03-04 ENCOUNTER — Ambulatory Visit: Payer: Self-pay | Admitting: Family Medicine

## 2019-04-19 ENCOUNTER — Ambulatory Visit (INDEPENDENT_AMBULATORY_CARE_PROVIDER_SITE_OTHER): Payer: Self-pay

## 2019-04-19 DIAGNOSIS — Z23 Encounter for immunization: Secondary | ICD-10-CM

## 2019-04-19 MED ORDER — COVID-19 MRNA VACC (MODERNA) 100 MCG/0.5ML IM SUSP (ROVER)
0.5000 mL | Freq: Once | INTRAMUSCULAR | Status: AC
Start: 2019-04-19 — End: 2019-04-19
  Administered 2019-04-19: 0.5 mL via INTRAMUSCULAR

## 2019-05-24 ENCOUNTER — Ambulatory Visit (INDEPENDENT_AMBULATORY_CARE_PROVIDER_SITE_OTHER): Payer: TRICARE Prime—HMO

## 2019-05-24 DIAGNOSIS — Z23 Encounter for immunization: Secondary | ICD-10-CM

## 2019-05-24 MED ORDER — COVID-19 MRNA VACC (MODERNA) 100 MCG/0.5ML IM SUSP (ROVER)
0.5000 mL | Freq: Once | INTRAMUSCULAR | Status: AC
Start: 2019-05-24 — End: 2019-05-24
  Administered 2019-05-24 (×2): 0.5 mL via INTRAMUSCULAR

## 2020-04-28 IMAGING — CR CHEST - 2 VIEW
1 series · 2 of 2 positions shown · non-contrast
Comparison: None.

CLINICAL DATA: Chest pain, shortness of Breath

EXAM:
CHEST - 2 VIEW

[Series 1: dg chest 2 view · 0.14mm/px · 2 of 2 slices shown]
[im 1/2]
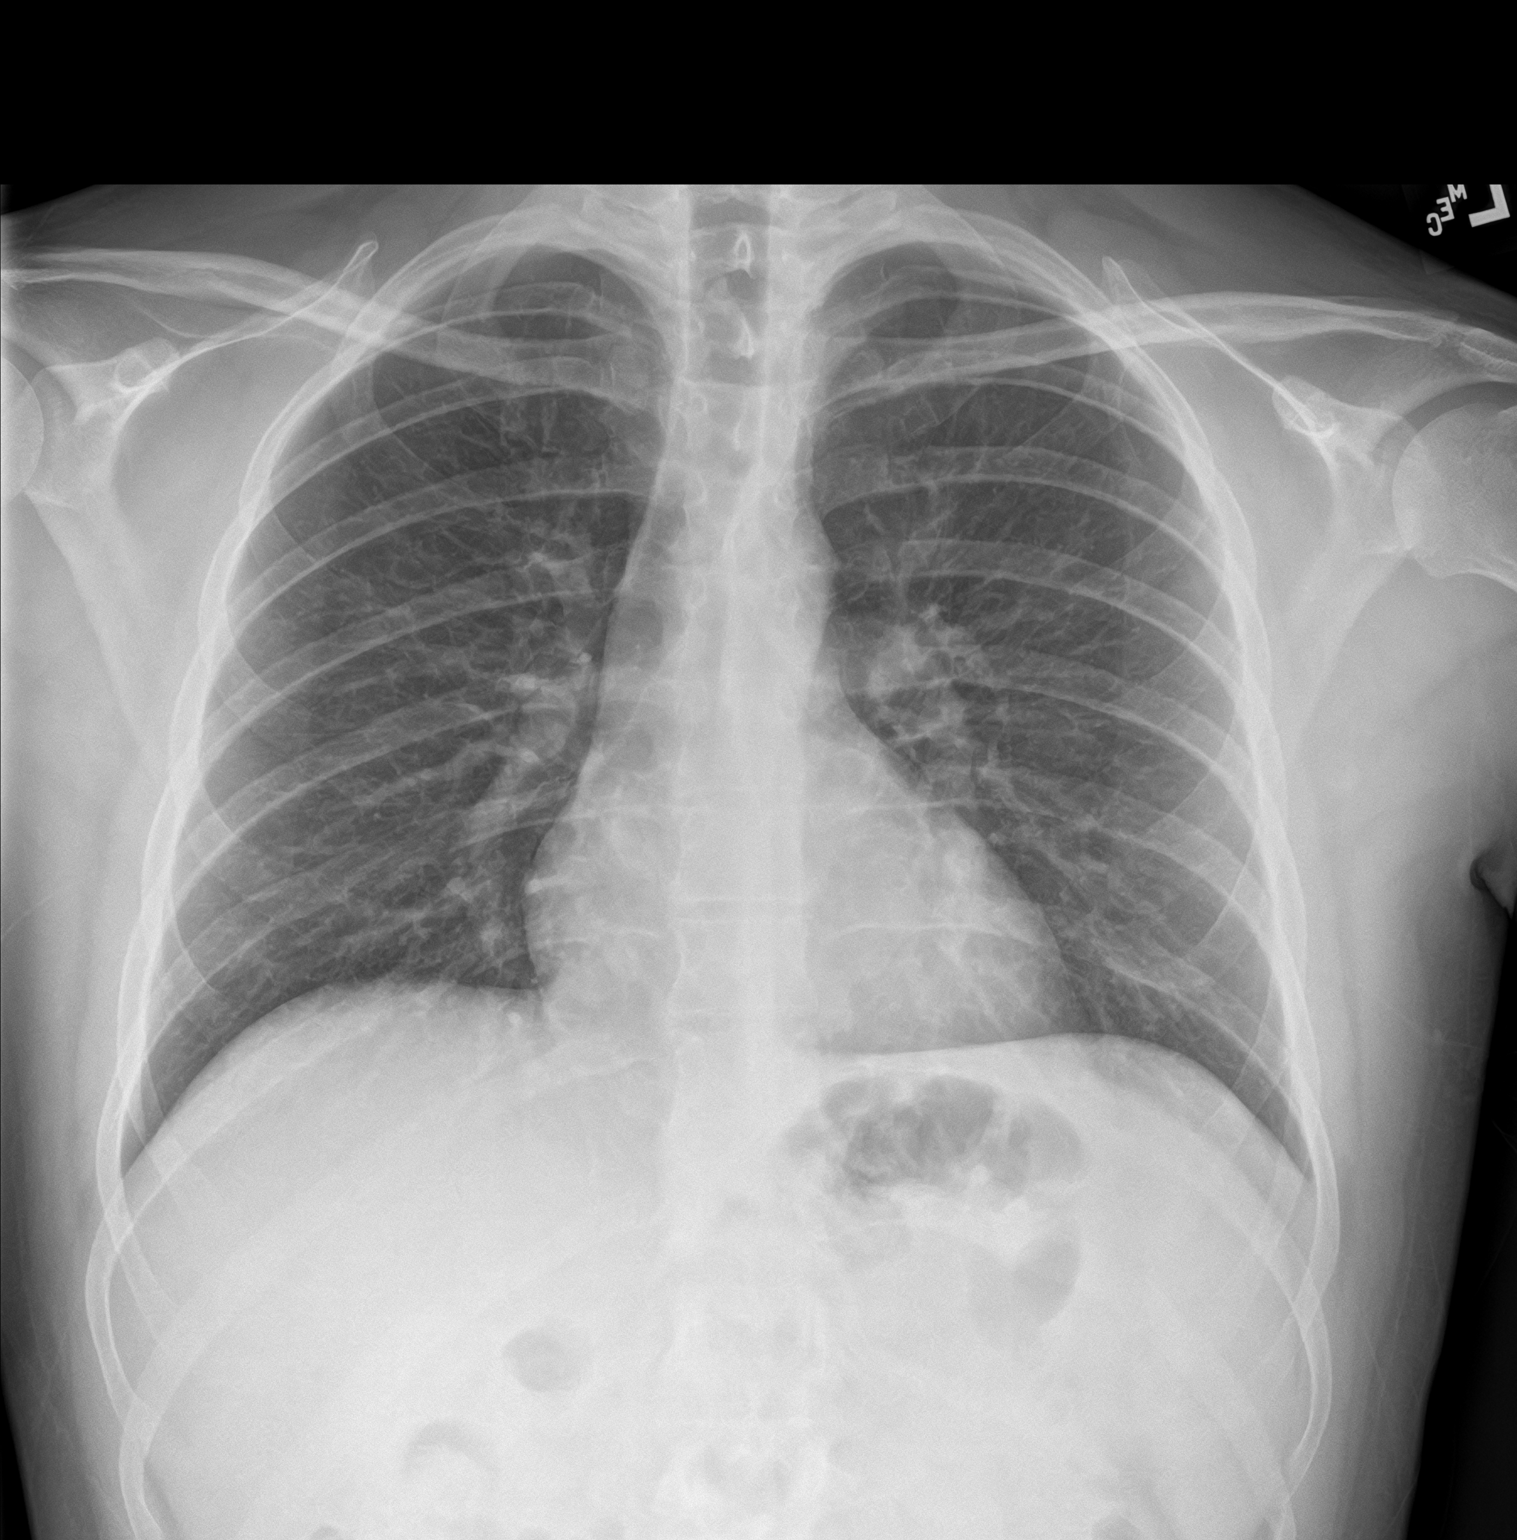
[im 2/2]
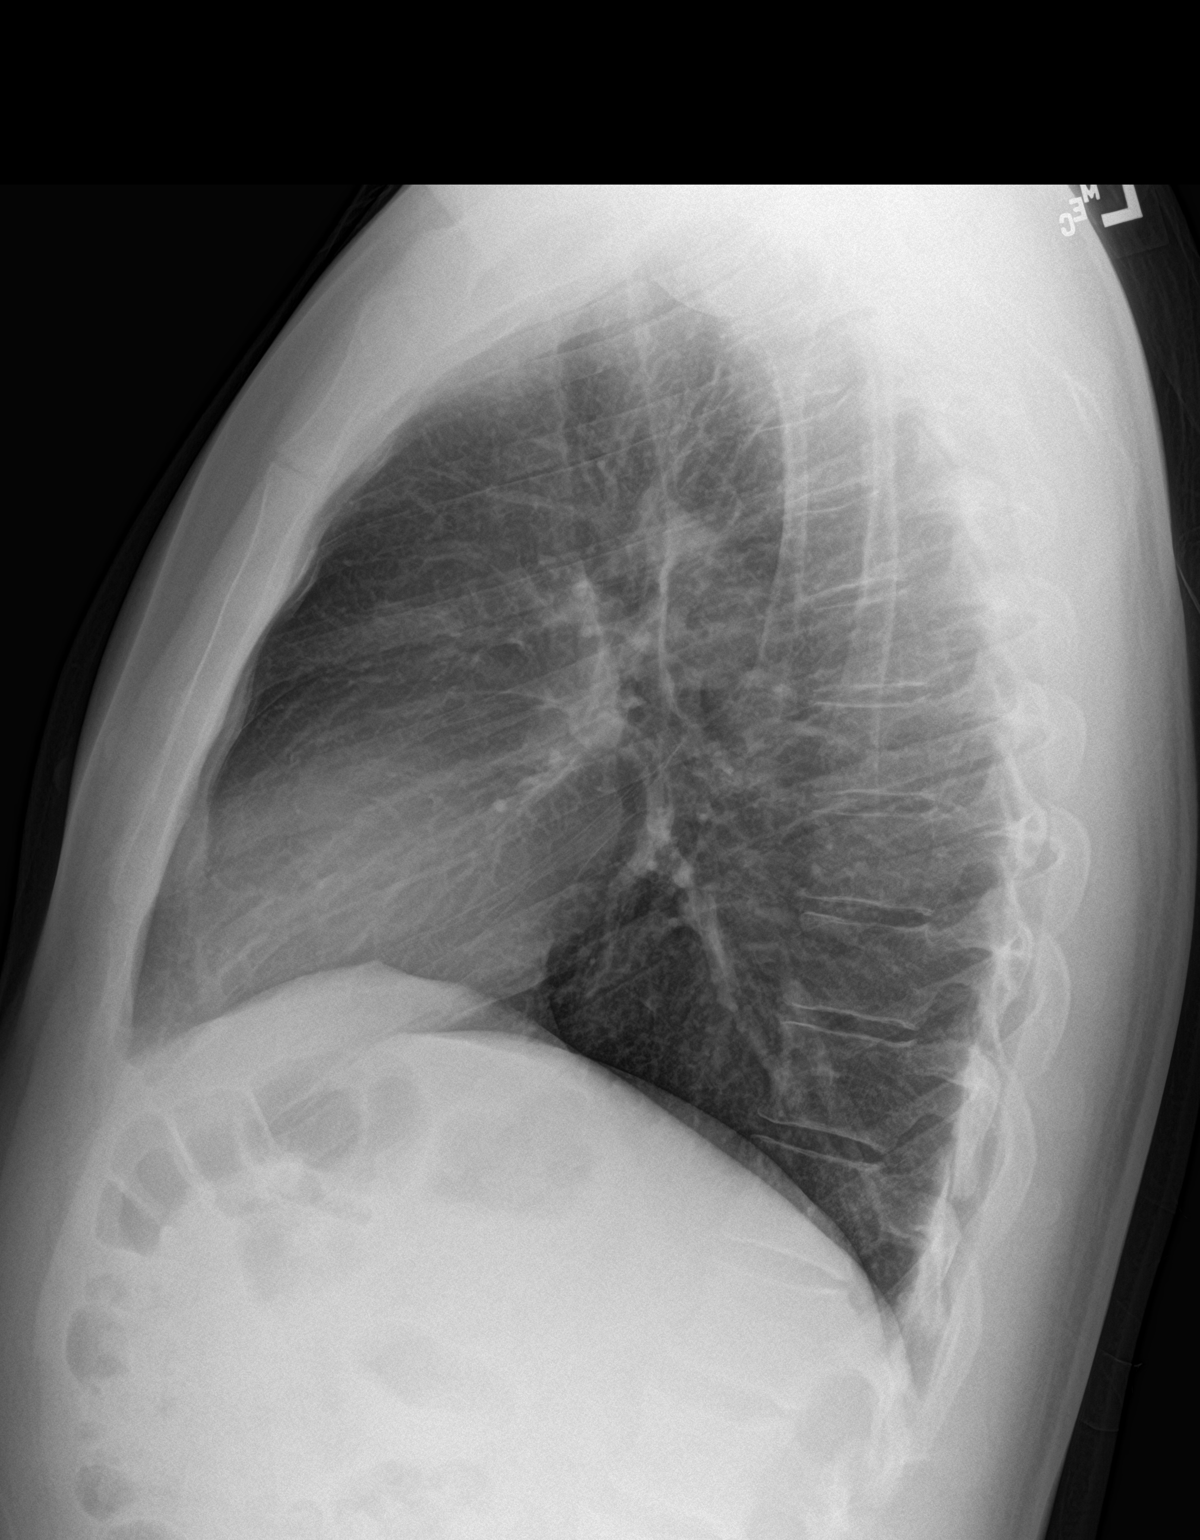

[2 of 2 positions shown; findings below may reference images not displayed]

FINDINGS: Heart and mediastinal contours are within normal limits. No focal
opacities or effusions. No acute bony abnormality.
IMPRESSION: No active cardiopulmonary disease.

## 2020-06-13 ENCOUNTER — Telehealth (INDEPENDENT_AMBULATORY_CARE_PROVIDER_SITE_OTHER): Payer: Self-pay | Admitting: Nurse Practitioner

## 2020-06-13 NOTE — Telephone Encounter (Signed)
Refill request    x  Pharmacy Faxed Request/Calling     Patient calling/presenting in clinic     If this is a patient request, I have informed the patient that Rx refill requests may take up to 3 business days to be addressed.     Medication(s):  1. Latuda 80mg  tablets       Pharmacy: No Pharmacies Listed   I have confirmed the above pharmacy with the patient.     Patient callback # There are no phone numbers on file.    Last visit in this department Visit date not found  Next visit in this department Visit date not found     Sent by 

## 2022-02-26 ENCOUNTER — Encounter (HOSPITAL_BASED_OUTPATIENT_CLINIC_OR_DEPARTMENT_OTHER): Payer: Self-pay | Admitting: Student in an Organized Health Care Education/Training Program

## 2022-02-26 DIAGNOSIS — M25512 Pain in left shoulder: Secondary | ICD-10-CM

## 2022-03-20 ENCOUNTER — Encounter (INDEPENDENT_AMBULATORY_CARE_PROVIDER_SITE_OTHER): Payer: Self-pay | Admitting: Family Practice

## 2022-03-20 DIAGNOSIS — M25512 Pain in left shoulder: Secondary | ICD-10-CM

## 2022-03-20 NOTE — Progress Notes (Signed)
Pending xrays for clinic prep

## 2022-03-25 ENCOUNTER — Ambulatory Visit (INDEPENDENT_AMBULATORY_CARE_PROVIDER_SITE_OTHER): Payer: Medicaid Other | Admitting: Family Practice

## 2022-03-25 VITALS — BP 125/77 | HR 77 | Temp 98.6°F

## 2022-03-25 DIAGNOSIS — M25312 Other instability, left shoulder: Secondary | ICD-10-CM
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
## 2022-03-26 ENCOUNTER — Other Ambulatory Visit: Payer: Self-pay

## 2022-04-18 ENCOUNTER — Encounter (INDEPENDENT_AMBULATORY_CARE_PROVIDER_SITE_OTHER): Payer: Self-pay | Admitting: Sports Medicine

## 2022-04-18 ENCOUNTER — Ambulatory Visit (INDEPENDENT_AMBULATORY_CARE_PROVIDER_SITE_OTHER): Payer: Medicaid Other | Admitting: Sports Medicine

## 2022-04-18 VITALS — BP 114/70 | HR 80 | Temp 97.6°F | Ht 68.0 in | Wt 160.0 lb

## 2022-04-18 DIAGNOSIS — M25312 Other instability, left shoulder: Secondary | ICD-10-CM

## 2022-04-18 NOTE — Patient Instructions (Addendum)
At this time please proceed with getting your CT scan of your shoulder.  Before returning to our office to review the CT scan please see your physician treating you for long COVID to obtain potential surgical clearance and discuss the risks of anesthesia with long COVID.    Should you elect to start physical therapy in the meantime please contact our office and we could place a physical therapy order.

## 2022-04-18 NOTE — Progress Notes (Unsigned)
[place], SPORTS MEDICINE    PRIMARY CARE PROVIDER:   No Pcp, Per Patient      REASON FOR VISIT:  Left shoulder instability    HISTORY: Kin Totty is a 32 year old male  who presents today for evaluation of left shoulder instability.  The patient is right-hand dominant.  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  He is also currently on long-term disability/permanent disability for long COVID.  He has symptoms of headache, spots in vision, difficulty breathing with exertion and talking and weight loss.  His shoulder instability 1st started in 2009 when he states that he had a fall and sustained a fracture in his right shoulder.  This was treated conservatively.  He then states that he had at least 1 left shoulder instability event per year.  His most recent shoulder instability event was April 06, 2022 while adjusting the temperature in the shower.  He has pain in the left shoulder joint and is currently wearing a sling.  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  He is here today as he is interested in surgical treatment as his instability in his left shoulder is affecting his quality of life.  He previously had a cortisone injection in 2019 with no improvement.  And he has done physical therapy in the past most recently as earliest 2019 without improvement.    RELEVANT HISTORY AND RISK FACTORS:  Long COVID with symptoms of headache, spots in vision, difficulty breathing on exertion and talking, and 50 lb weight loss.  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  History of bipolar disease.    PHYSICAL EXAM:  VITALS: BP 114/70   Pulse 80   Temp 97.6 F (36.4 C)   Ht 5\' 8"  (1.727 m)   Wt 72.6 kg (160 lb)   BMI 24.33 kg/m   GENERAL: A+Ox3. INAD. Well appearing and well groomed. Normal gait  MENTAL STATUS: Pleasant and  cooperative. Alert and oriented x3 with normal mood and affect.  Vascular: Pulses are palpable 2+, capillary refills to digits are normal.  Skin: No wounds/lesions/ulcers. Skin warm & dry.  Respiratory: No respiratory distress    MUSCULOSKELETAL:    Shoulder:  Left   Normal posture.  Shoulder: No swelling.  Range of Motion :  Limited range of motion secondary to pain.  Forward flexion 90.  External rotation 30.  Internal rotation hip pocket.   Contralateral shoulder : Forward flexion 180. External Rotation: 60. IR L1  Tenderness: No tenderness at the [facility] joint, biceps, anterior, posterior joint.  Strength: 5-/5 Supraspinatus, 5/5 External Rotators, 5/5 Subscapularis    Impingement Signs:  Deferred  Stability:  Positive apprehension.  Positive relocation.  Sensation intact to light touch in the bilateral upper extremities.  2+ radial pulses.    ==========    IMAGING FINDINGS: I independently reviewed and interpreted the patient's imaging today.  Xrays:  Radiographs performed at imaging healthcare specialist January 2024 reveal good alignment and no advanced  MRI:  The patient's noncontrast MRI of the left shoulder performed February 24, 2022 reveals likely labral irregularity with intact rotator cuff.  There is evidence of a Hill-Sachs lesion with with likely remote injury to the posterior aspect of the glenoid.    ==========    ASSESSMENT & PLAN: Waneta Martins  is a 32 year old male who is right-hand dominant with a significant past medical history of long COVID currently on permanent disability with a long-term history of left shoulder instability.  His most recent dislocation was April 06, 2022 while changing the temperature in his shower.  His 1st injury to his left shoulder was 2009 and he states he was diagnosed with a fracture at that time and treated nonoperatively.  Over the duration of his shoulder instability he has had at least 1 year.  We spent time today in clinic discussing with the patient that  he seems to be experiencing both pain and instability.  And while shoulder surgery can address shoulder instability it would not address the degree of of chronic shoulder pain he is experiencing from the lack of using the left shoulder with his use of the sling.  We strongly advised the patient to start some formal physical therapy to work on overall mobility and strength.  At this time he is declined physical therapy.  We will proceed with a CT scan of the left shoulder.  Prior to returning to review the CT results he needs to be seen by his physician treating him for long COVID to determine whether or not it would be reasonable to consider surgical treatment.  However, he was advised that before considering surgical treatment we would also like him to complete physical therapy.  He expressed understanding.  All other questions and concerns were addressed.    -Patient was educated on clinical and imaging findings.  -Patient verbalizes understanding of all discussions today, and all questions were answered satisfactorily.    ===========================  Past medical history:  Long COVID, history of irregular heart rhythm treated with cardiac ablation, bipolar disease  Past surgical history:  Cardiac ablation  Allergies:  Anti-inflammatories  Medications:  Truvada, Concerta, Lamictal, Wellbutrin  Family history:  Reviewed and negative per patient        family history is not on file.  Allergies   Allergen Reactions    Nsaids Itching

## 2022-06-13 DIAGNOSIS — U099 Post covid-19 condition, unspecified: Secondary | ICD-10-CM

## 2022-08-01 ENCOUNTER — Telehealth: Payer: Self-pay

## 2022-08-01 NOTE — Telephone Encounter (Signed)
CPAT patient with "Long COVID" referral open since May. It's been over 10 days and no triage notes/scheduling instructions from Pulm per new workflow. Pt reports "diagnosed with COVID with PCR test on 12/23/2022. Never really recovered from COVID, slowly progressively getting better, but feeling very ill for over 8-9 month with fatigue, brain fog, shortness of breath, trouble with speech issues. Sometimes chronic cough, but it comes and goes. Chest imaging done at [facility]." Please advise on scheduling. Thanks! --Amber, CPAT, ext. [phone]

## 2022-08-05 NOTE — Telephone Encounter (Signed)
To long covid pool

## 2022-08-11 NOTE — Telephone Encounter (Signed)
Patient called and is following up on review. Please advise for scheduling purposes. Thank you

## 2022-08-14 NOTE — Telephone Encounter (Signed)
Pt has been scheduled      No further action needed. Closing TE

## 2022-08-23 ENCOUNTER — Ambulatory Visit
Admission: RE | Admit: 2022-08-23 | Discharge: 2022-08-23 | Disposition: A | Discharge status: Home / Self Care | Payer: Medicaid Other | Attending: Rehabilitative and Restorative Service Providers" | Admitting: Rehabilitative and Restorative Service Providers"

## 2022-08-23 DIAGNOSIS — M25312 Other instability, left shoulder: Secondary | ICD-10-CM | POA: Insufficient documentation

## 2022-08-23 DIAGNOSIS — S42292A Other displaced fracture of upper end of left humerus, initial encounter for closed fracture: Secondary | ICD-10-CM

## 2022-08-29 ENCOUNTER — Ambulatory Visit (INDEPENDENT_AMBULATORY_CARE_PROVIDER_SITE_OTHER): Payer: Medicaid Other | Admitting: Sports Medicine

## 2022-08-29 DIAGNOSIS — M25512 Pain in left shoulder: Secondary | ICD-10-CM

## 2022-08-29 DIAGNOSIS — M25312 Other instability, left shoulder: Secondary | ICD-10-CM | POA: Insufficient documentation

## 2022-08-29 NOTE — Patient Instructions (Addendum)
Sports Medicine Surgery Scheduling with Dr. Raymon Mutton    Please call Sherlie Ban @ [phone] after a minimum of  3 business days to begin the surgery scheduling process. If your case is urgent, Dr. Alford Highland will communicate with the surgery coordinator to get your surgery scheduled as soon as possible.    Insurance: Scheduling your surgery generally requires authorization from your insurance (including PPOs). This may take 10 business days and in some cases longer. If you have PPO insurance plan and would like to have an estimated amount of the out of pocket expenses, please call [phone].    Preoperative and 1st postoperative appointments:  Appointments will be scheduled for you as part of your surgery scheduling process.    Disability Paperwork:   Please leave your disability request form filled out at the front desk, and pay the $10 fee for processing.   Please note, we fulfill all [place] claims online after your date of surgery. You must go to [email or web address], to confirm your eligibility and begin your disability claim. Once this portion of your claim is finished, please call [phone] to advise Dr. Trellis Moment assistant. The call center will ensure that her assistant receives your message. Please provide the call center with your SDI claim receipt number ("R Number") and your expected return to work date.   If you have disability forms from your employer or school these can be left at the front desk with your disability request form. Be sure to note what you would like done with the completed forms.  Allow 7-10 days for our office to process all forms.    Post-operative Physical Therapy: When you have a surgery date, please call to schedule your first post-operative physical therapy appointment. Most patients will see a therapist within the first week after surgery. This visit needs to be scheduled three weeks prior to surgery or as soon as possible. You are welcome to go to  physical therapy at the location of your choice, please provide this clinic information to the office so a PT order can be entered. If you are unsure where you want to go please call your insurance company for a list of providers you are authorized to see for physical therapy.     Imaging: If you did NOT have your x-rays or MRI done at [facility], please bring your CD and radiology report on the day of surgery.    Medical Clearance: If you have been told that you will require medical clearance prior to surgery, please arrange a visit with your primary care physician for pre-operative medical clearance and have any records or medical clearance documentation faxed to the surgery scheduler. Once you have been formally cleared and all tests results have been received we can provide you with a date for surgery.     LATARJET PROCEDURE    Often, anterior shoulder dislocations can be fixed using arthroscopic, or minimally invasive, surgery to fix the ligaments around the shoulder. However, when the socket (glenoid) is missing a significant amount of bone or in the revision setting, a minimally invasive surgery is often not successful at maintaining the shoulder in its socket and a reconstruction using your own bone gives the best results. The two most common sites of bone are from your coracoid bone (a knuckle of bone near your shoulder) or your iliac crest (hip bone). In the Latarjet procedure, an incision is made over the front part of your shoulder and the coracoid bone is exposed. About 2cm  of the bone is cut and moved to the front part of the shoulder in order to make a new shoulder socket. The picture below depicts how this is done.  The recovery with this procedure is similar to a labral repair, but in patients with bone loss, the risk of recurrent dislocation/instability is significantly lower.        REMPLISSSAGE PRCEDURE     The Remplissage procedure is primarily indicated for patients experiencing shoulder instability  who also have a Hill-Sachs lesion--a compression fracture on the humeral head caused by shoulder dislocation. By addressing both instability and the associated Hill-Sachs lesion, Remplissage offers a more complete way to enhance joint stability and restore normal shoulder function.  This technique is preformed when the Hill-Sachs lesion is very large and 'engaging' the anterior glenoid with little overhead movement (i.e. dislocating very easily due to the large Hill-Sachs lesion, as well as the Bankart lesion). In these situations, a labral repair alone may not be sufficient.   Remplissage is typically performed as an arthroscopic procedure under general anesthesia. Small incisions are made to introduce a camera (arthroscope) and specialized instruments into the shoulder joint. The surgeon carefully evaluates the extent of shoulder instability and identifies the Hill-Sachs lesion. To stabilize the joint, the infraspinatus tendon is advanced into the humeral head defect, effectively filling the lesion. Subsequently, the tendon is secured in place using sutures or anchors. This technique not only fills the void but also acts as a barrier, preventing the humeral head from engaging the glenoid rim during certain movements, thus reducing the risk of dislocation.

## 2022-08-29 NOTE — Progress Notes (Signed)
[facility]PART[facility] OF ORTHOPAEDIC SURGERY, SPORTS MEDICINE    PRIMARY CARE PROVIDER:   Dy, Diane Jazmin      REASON FOR VISIT:  Left shoulder instability    HISTORY: Gordon Huang is a 32 year old male  who presents today for evaluation of left shoulder instability.  The patient is right-hand dominant.  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      He was last seen in March.  At that time, he was deemed not a good candidate for surgery given his long COVID symptoms.  He has since gotten clearance from his COVID doctor to move forward with surgery if needed.  He reports that he continues to have very frequent instability episodes of the left shoulder which are very low energy.  He feels like he has very poor use of the shoulder.  He would like to move forward with surgery if possible.      RELEVANT HISTORY AND RISK FACTORS:  Long COVID. 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  History of bipolar disease.    PHYSICAL EXAM:  VITALS: There were no vitals taken for this visit.  GENERAL: A+Ox3. INAD. Well appearing and well groomed. Normal gait  MENTAL STATUS: Pleasant and cooperative. Alert and oriented x3 with normal mood and affect.  Vascular: Pulses are palpable 2+, capillary refills to digits are normal.  Skin: No wounds/lesions/ulcers. Skin warm & dry.  Respiratory: No respiratory distress    MUSCULOSKELETAL:    Shoulder:  Left   Normal posture.  Shoulder: No swelling.  Range of Motion :  Limited range of motion secondary to pain.  Forward flexion 1300.  External rotation 30.  Internal rotation hip pocket.  He is apprehensive during range of motion exam.  Contralateral shoulder : Forward flexion 180. External Rotation: 60. IR L1  Tenderness: No tenderness at the [facility] joint, biceps, anterior, posterior joint.  Strength: 5-/5 Supraspinatus, 5/5 External Rotators, 5/5 Subscapularis  Impingement Signs:  Deferred  Stability:  Positive  apprehension.  Positive relocation.  He guards significantly such that load and shift is difficult.  Sensation intact to light touch in the bilateral upper extremities.  2+ radial pulses.    ==========    IMAGING FINDINGS: I independently reviewed and interpreted the patient's imaging today.  Xrays:  Radiographs performed at imaging healthcare specialist January 2024 reveal good alignment and no advanced osteoarthritis.  MRI:  The patient's noncontrast MRI of the left shoulder performed February 24, 2022 reveals likely anterior labral tear with intact rotator cuff.  There is evidence of a Hill-Sachs lesion with with likely remote injury to the posterior aspect of the glenoid.  The Hill-Sachs lesion is moderate in size.  There was no glenoid bone loss.  CT scan of the left shoulder shows a moderate to large Hill-Sachs lesion.  ==========    ASSESSMENT & PLAN: Gordon Huang is a 32 year old male who is right-hand dominant with a significant past medical history of long COVID currently on permanent disability with a long-term history of left shoulder instability.      He continues to have both shoulder instability as well as shoulder pain.  We went over his diagnosis today as well his imaging today. We had a further discussion regarding operative versus nonoperative treatment. The patient  would like to proceed with operative treatment which would be left shoulder arthroscopy with labral repair, capsulorrhaphy, remplissage procedure, possible open  Latarjet procedure. We discussed the details of surgery as well as the rehabilitation today. We also discussed the risks of surgery including risk of anesthesia, blood loss, infection, DVT, incomplete pain relief, no pain relief, stiffness, neurovascular injury, reinjury or retear.  He will need clearance from his COVID physician prior to surgery.  He is aware that the surgery will treat instability but may not have any effect on his pain.  All questions were answered  and surgery scheduling information was provided.    -Patient was educated on clinical and imaging findings.  -Patient verbalizes understanding of all discussions today, and all questions were answered satisfactorily.    ===========================  Past medical history:  Long COVID, history of irregular heart rhythm treated with cardiac ablation, bipolar disease  Past surgical history:  Cardiac ablation  Allergies:  Anti-inflammatories  Medications:  Truvada, Concerta, Lamictal, Wellbutrin  Family history:  Reviewed and negative per patient        family history is not on file.  Allergies   Allergen Reactions    Nsaids Itching

## 2022-09-12 ENCOUNTER — Encounter (INDEPENDENT_AMBULATORY_CARE_PROVIDER_SITE_OTHER): Payer: Self-pay | Admitting: Sports Medicine

## 2022-10-20 ENCOUNTER — Ambulatory Visit: Payer: Medicaid Other | Attending: Pulmonary Disease | Admitting: Pulmonary Disease

## 2022-10-20 ENCOUNTER — Encounter (HOSPITAL_BASED_OUTPATIENT_CLINIC_OR_DEPARTMENT_OTHER): Payer: Self-pay | Admitting: Pulmonary Disease

## 2022-10-20 VITALS — BP 126/75 | HR 72 | Temp 96.6°F | Ht 68.0 in | Wt 158.9 lb

## 2022-10-20 DIAGNOSIS — M25312 Other instability, left shoulder: Secondary | ICD-10-CM | POA: Insufficient documentation

## 2022-10-20 DIAGNOSIS — G9332 Myalgic encephalomyelitis/chronic fatigue syndrome: Secondary | ICD-10-CM | POA: Insufficient documentation

## 2022-10-20 DIAGNOSIS — R41840 Attention and concentration deficit: Secondary | ICD-10-CM | POA: Insufficient documentation

## 2022-10-20 DIAGNOSIS — R0609 Other forms of dyspnea: Secondary | ICD-10-CM | POA: Insufficient documentation

## 2022-10-20 DIAGNOSIS — Z7409 Other reduced mobility: Secondary | ICD-10-CM | POA: Insufficient documentation

## 2022-10-20 DIAGNOSIS — U099 Post covid-19 condition, unspecified: Secondary | ICD-10-CM | POA: Insufficient documentation

## 2022-10-20 MED ORDER — LORAZEPAM 1 MG OR TABS: 1.00 mg | ORAL_TABLET | ORAL | Status: AC | PRN

## 2022-10-20 MED ORDER — NALTREXONE HCL 50 MG OR TABS: 50.00 mg | ORAL_TABLET | ORAL | Status: AC

## 2022-10-20 MED ORDER — METHYLPHENIDATE HCL 54 MG OR TBCR: 54.00 mg | EXTENDED_RELEASE_TABLET | Freq: Every day | ORAL | Status: AC

## 2022-10-20 MED ORDER — LAMOTRIGINE 150 MG OR TABS: 150.00 mg | ORAL_TABLET | Freq: Two times a day (BID) | ORAL | Status: AC

## 2022-10-20 MED ORDER — BUPROPION XL (DAILY) 300 MG OR TB24: 300.00 mg | ORAL_TABLET | Freq: Every day | ORAL | Status: AC

## 2022-10-20 MED ORDER — EMTRICITABINE-TENOFOVIR 200-300 MG OR TABS: 1.00 | ORAL_TABLET | Freq: Every day | ORAL | Status: AC

## 2022-10-20 NOTE — Progress Notes (Signed)
[facility] Visit      ---------------------(data below generated by Skipper Cliche, MD)--------------------    I personally spent total 65 minutes in face-to-face and non-face-to-face activities  related to the patient's visit today, excluding any separately reportable  services/procedures       Demographics:  Medical Record #: [id number]  Date: October 20, 2022  Patient Name: Gordon Huang  DOB: 1990/04/11  Age: 32 year old  Sex: male       ASSESSMENT AND PLAN     Gordon Huang is a 32 year old male who was evaluated in [facility] today   His exercise tolerance remains reduced after last COVID infection.  His symptoms are not suggestive of post viral asthma.  His exam today did not quite meet criteria for POTS although he may have some dysautonomia which would be consistent with his fatigue and post exertional malaise    There is likely some deconditioning as well.    Recommend starting with PFTs and ECHO  Referral to Pulmonary Rehab placed.\      Given otherwise normal exam today, there is no absolute contraindication to proceeding with surgery.          The plan was carefully reviewed verbally with the patient and also affirmed that the patient understood next steps and follow up plan.      HPI   31 y/o referred by Dr. Merilynn Finland in anticipation of possible shoulder surgery.  Had previously been deemed in MAR 2023 not to be a good candidate for surgery due to his "Long COVID" symptoms. Subsequently was cleared by his "COVID" doctor    Injured shoulder in 2009  Reinjured shoulder in 2017  It now becomes dislocated with only minimal movement    Pertinent medical issues:  Afib treated with ablation in 2016  Bipolar, s/p multiple suicidal attempts     On:   wellbutrin  Naltrexone (4 mg)--since April 2024, improves brain fog  Concerta  Lamictal and Truvada    ?h/o childhood asthma    In APR 2022 he was evaluated at [facility] for  cough SOB and lightheadedness  Elevated LFTs noted  He was given Advair but has not taken this in years    Date of positive test: NOV 2023 (2 other infections, 1st in AUG 2020--mild, like a bad cold, sick for a week and a half, then again end of 2021, sick for almost a month--fatigue, SOB, no taste or smell)    Symptoms at onset: most recent infection was worst of all, could not get out of bed, could not walkup or down stairs without stopping  By MAY 2024 still unable to complete going upstairs without stopping or getting lightheaded  Gets tired very easily  Does not sleep well  He just got a new mattress    Ain JUL 2024, he was evaluated by [facility]  At that time, he reported cognitive dysfunction and was noted to have mild word finding difficulty at the conversational level  Delayed memory was observed  Considerations for Next Tx Session: REC: Dx/tx via RBANS to further define goal areas  Treatment/Interventions - SLP: Cognitive linguistic tasks  Frequency and Duration: x1 a week for 8 visits    Hospitalized?  No, monitored at home    COVID Treatments:   paxlovid    CURRENT SYMPTOMS    GENL  GENLpostcovid: Fatigue: yes, Fevers/chills:yes, or Night sweats: no    QOL  MOBILITY -- has problems walking about  SELF-CARE -- has problems washing or dressing self USUAL ACTIVITIES -- has problems doing usual activities (work, study, housework, family or leisure activities) PAIN/DISCOMFORT -- has pain or discomfort    HEENT  Aegusia or Hypogeusia (taste loss): no, Anosmia or hyposmia (smell loss): no, congestion/rhinorrhea: yes, Sore Throat: no, Throat clearing, mucus or fullness:yes, or dysphagia:no      RESP  RESP POST COVID: SOB: yes, COUGH:no, PND:no, or ORTHOPNEA:no  [place] mMRC Score: mMRC Grade 2- Walks slower than people of the same age on the level because of breathlessness, or has to stop for breath when walking on own pace on the level.    SOB is improving; however,   HOUSE HAS TWO FLIGHTS OF STAIRS AND  IS TIRED BY THE TIME HE GETS UP TO THE 3RD FLOOR      CV  CV POST COVID: CHEST PAIN:yes  Maybe once or twice a month  Sharp like a knife  Worse with inspiration  Typically left lower anterior chest    GI  GI POST COVID: diarrhea  Heartburn more than once a week but   Bad morning breath    RHEUM  Joint pains? Right knee clicks, exp when going downstairs, tennis elbow on the right due to overcompensation  Body Aches/Myalgias:yes  Had a back in jury in 2015 broke L2,3,5  [place] Seat out of a 2nd story window  Chronic back pain both sides    NEURO  headaches    SLEEP--snores  Usual bedtime (lights out): 2 am  Reported sleep latency (average):   Nocturnal awakenings: 1-3 times/night (but he just got a new mattress)  Reported sleep latency after nocturnal awakening (average):  anywhere from a few minutes to an hour  Wake up time (lights on):   Feeling refreshed upon awakening: NO  Total perceived sleep time (average):   Afternoon Naps: sometimes, because he is exhausted      Review of Systems:  As noted in HPI Review of Systems - All others negative    Past Medical History:  Active Ambulatory Problems     Diagnosis Date Noted    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          Physical Exam:  Speech slightly pressured    SUPINE  O2SAT 99    HR 60  BP 118/77    STANDING   HR 84  BP  116/77  HR 77  BP  117/84  HR 81  BP  120/86  HR  79  BP  120/86    GENERAL: alert, no distress Use of accessory muscles YES/NO: yes  MENTAL STATUS: Full and  Appropriate  HEENT: EOMI, Sclera clear, anicteric, and Neck supple with midline trachea  CHEST PHYSICAL FINDINGS: no audible wheezing. Bilat chest excursion symmetric  ABDOMEN INSPECTION: flat  EXTREMITY LOCATION: lower extremities without edema  BEHAVIORAL: Reports  mild Anxiety.  NEUROLOGICAL: Gait normal. Moves all four extremities spontaneously. Speech normal.  SKIN: without clubbing, cyanosis or rash    LABS    Lab Results   Component Value Date    WBC 5.0 07/30/2019    HGB 11.6 07/30/2019    HCT 38.0 07/30/2019    PLT 386 (H) 07/30/2019     Lab Results   Component Value Date    TSH 1.31 07/30/2019     Lab Results   Component Value Date    ABSEOS 0.0 07/30/2019     No results found for: CO19@  No results found for: CRP, ESR, IL6SERUM, FERRITIN  Lab Results   Component Value Date    BUN 7 07/30/2019    CREAT 0.75 07/30/2019    NA 142 07/30/2019    K 4.0 07/30/2019    CL 104 07/30/2019    BICARB 28 07/30/2019    AST 25 07/30/2019    ALT 14 07/30/2019    ALK 107 07/30/2019         IMAGING/STUDIES:   I have personally reviewed and analyzed labs, PFTs and imaging studies        Orders this encounter:  @ORDERDX @   No orders of the defined types were placed in this encounter.

## 2022-10-20 NOTE — Patient Instructions (Signed)
Recommend continued non-pharmacologic treatment options dicussed today.  -stay hydrated  -increase sodium in the diet  -compression stockings  -  ([email or web address]            Patient Instructions   Recommended Lifestyle Changes for Patients with Postural Orthostatic Tachycardia Syndrome (POTS).    Increase fluid consumption.  -We recommend increasing fluid consumption to 2-3 L per day, which is equal to 8-10 cups of water per day. However, the fluids do not have to be just water. We encourage any fluids that do not contain caffeine or alcohol, as caffeine and alcohol can increase dehydration.    Increase salt consumption.  -We recommend increasing salt consumption to 8-10 g (8,000-10,000 mg) per day. You can find the sodium content on the nutrition labels on the back of foods. To convert from sodium to salt, see conversion below.    1 gram of salt = 388 mg of sodium  2 grams of salt = 775 mg of sodium  3 grams of salt = 1163 mg of sodium  4 grams of salt = 1550 mg of sodium  5 grams of salt = 1938 mg of sodium  6 grams of salt = 2326 mg of sodium  7 grams of salt = 2713 mg of sodium  8 grams of salt = 3101 mg of sodium  9 grams of salt = 3488 mg of sodium  10 grams of salt = 3876 mg of sodium    -      Wear compression stockings.  -There are many options with respect to compression wear.    - Manufacturers of 'normal' looking socks include Nabee or Rejuva brand socks to be a good alternative. These are compression socks to come in different colors and patterns. You can find them at www.[email or web address] or https://www.chapman.net/.    - Locally, compression wear including CW-X Yoga pants, compression socks and leg sleeves are available at [employer] located at [address], [place], [place] [address]. These may also be found at local sporting goods store near your home.    Spanx compression wear is another option, many individuals have used a  [employer]ist Spanx with success.    GasPicks.com.br is another online site offers compression wear.     Finally, recognizing that the great amount compressed, and the greater the compression will offer the best results. If the above have been tried and while tolerated did not offer sufficient benefit, custom compression wear with up to 20-20mmHg compression may be ordered at a local [employer] provider.     Exercise plays a critical role in the treatment of patients with Postural Orthostatic Tachycardia Syndrome.    As a consequence of POTS, affected patients are also often extremely deconditioned. Amongst individuals who do not have POTS, deconditioning may play a role in their response to standing.    It is important to initiate aerobic activity in a slow measured pace. For some patients, just being upright is difficult. These patients may consider recumbent cycling or swimming as an option.    Dr. Lenis Noon has demonstrated the physiologic value of aerobic exercise in POTS patients.

## 2022-10-21 ENCOUNTER — Encounter (HOSPITAL_BASED_OUTPATIENT_CLINIC_OR_DEPARTMENT_OTHER): Payer: Self-pay | Admitting: Pulmonary Disease

## 2022-10-21 DIAGNOSIS — R41840 Attention and concentration deficit: Secondary | ICD-10-CM | POA: Insufficient documentation

## 2022-10-21 DIAGNOSIS — R0609 Other forms of dyspnea: Secondary | ICD-10-CM | POA: Insufficient documentation

## 2022-10-21 DIAGNOSIS — U099 Post covid-19 condition, unspecified: Secondary | ICD-10-CM | POA: Insufficient documentation

## 2022-10-21 DIAGNOSIS — Z7409 Other reduced mobility: Secondary | ICD-10-CM | POA: Insufficient documentation

## 2022-10-31 ENCOUNTER — Encounter (INDEPENDENT_AMBULATORY_CARE_PROVIDER_SITE_OTHER): Payer: Self-pay | Admitting: Rehabilitative and Restorative Service Providers"

## 2022-10-31 ENCOUNTER — Ambulatory Visit (INDEPENDENT_AMBULATORY_CARE_PROVIDER_SITE_OTHER): Payer: Medicaid Other | Admitting: Rehabilitative and Restorative Service Providers"

## 2022-10-31 ENCOUNTER — Encounter (INDEPENDENT_AMBULATORY_CARE_PROVIDER_SITE_OTHER): Payer: Self-pay | Admitting: Hospital

## 2022-10-31 VITALS — BP 138/78 | HR 99 | Temp 98.4°F

## 2022-10-31 DIAGNOSIS — G8918 Other acute postprocedural pain: Secondary | ICD-10-CM

## 2022-10-31 DIAGNOSIS — M25512 Pain in left shoulder: Secondary | ICD-10-CM

## 2022-10-31 MED ORDER — ONDANSETRON 4 MG OR TBDP
4.0000 mg | ORAL_TABLET | Freq: Three times a day (TID) | ORAL | 0 refills | Status: DC | PRN
Start: 2022-10-31 — End: 2022-12-01

## 2022-10-31 MED ORDER — NALOXONE HCL 4 MG/0.1ML NA LIQD
NASAL | 0 refills | Status: AC
Start: 2022-10-31 — End: ?

## 2022-10-31 MED ORDER — NAPROXEN 500 MG OR TABS
500.0000 mg | ORAL_TABLET | Freq: Two times a day (BID) | ORAL | 0 refills | Status: AC
Start: 2022-10-31 — End: ?

## 2022-10-31 MED ORDER — OXYCODONE HCL 5 MG OR TABS
5.0000 mg | ORAL_TABLET | ORAL | 0 refills | Status: AC | PRN
Start: 2022-10-31 — End: ?

## 2022-10-31 MED ORDER — DOCUSATE SODIUM 250 MG OR CAPS
250.0000 mg | ORAL_CAPSULE | Freq: Two times a day (BID) | ORAL | 0 refills | Status: AC
Start: 2022-10-31 — End: ?

## 2022-10-31 NOTE — Progress Notes (Signed)
[facility] Department of Orthopedic Surgery (Sports Medicine)      Primary Care Physician Dy, Lavonia Drafts    Surgical Attending Physician:   Dr. Lamont Dowdy    CC: Left shoulder instability    HPI:     This is a 32 year old male who presents to clinic today for their follow up appointment. The patient is scheduled for:  LEFT SHOULDER ARTHROSCOPY, LABRAL REPAIR, CAPSULAR PLICATION, POSSIBLE REMPLISSAGE, POSSIBLE OPEN LATARJET PROCEDURE  on (11/19/22)    Long COVID. 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  History of bipolar disease     Past medical history:  Long COVID, history of irregular heart rhythm treated with cardiac ablation, bipolar disease  Past surgical history:  Cardiac ablation  Allergies:  Anti-inflammatories  Medications:  Truvada, Concerta, Lamictal, Wellbutrin  Family history:  Reviewed and negative per patient    Family/personal history anesthesia issues: no  Family/personal history of clotting or bleeding disorder: yes - Dad TTP  Personal history of DM: no    History of sleep apnea? no  Taking prescribed weight loss medication?no    PT location: [facility]    The patient was seen by [facility] long COVID clinic October 20, 2022 with documentation of normal exam and no absolute contraindications to proceeding with surgery.    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    No past surgical history on file.    Current Outpatient Medications   Medication Sig Dispense Refill    (TRUVADA) emtricitabine 200 mg-tenofovir disoproxil 300 mg tablet Take 1 tablet by mouth  daily.      buPROPion (WELLBUTRIN XL) 300 MG XL tablet Take 1 tablet (300 mg) by mouth daily.      lamoTRIgine (LAMICTAL) 150 MG tablet Take 1 tablet (150 mg) by mouth 2 times daily.      LORazepam (ATIVAN) 1 MG tablet Take 1 tablet (1 mg) by mouth every 4 hours as needed for Anxiety.      methylphenidate (CONCERTA) 54 MG Controlled-Release tablet Take 1 tablet (54 mg) by mouth daily.      naltrexone (DEPADE) 50 MG tablet Take 1 tablet (50 mg) by mouth. Compound into 50mL of water and 50mg  = 4ml       No current facility-administered medications for this visit.       Allergies   Allergen Reactions    Nsaids Itching         Family History   Problem Relation Name Age of Onset    Asthma Father      COPD Father      Other Father          TTP       Social History     Socioeconomic History    Marital status: Divorced     Spouse name: Not on file    Number of children: Not on file    Years of education: Not on file    Highest education level: Not on file   Occupational History    Not on  file   Tobacco Use    Smoking status: Former     Average packs/day: 0.5 packs/day for 11.0 years (5.5 ttl pk-yrs)     Types: Cigarettes     Start date: 2006     Quit date: 2017     Years since quitting: 7.7     Passive exposure: Past    Smokeless tobacco: Former    Tobacco comments:     Mother smoked   Vaping Use    Vaping status: Former    Substances: Nicotine, THC   Substance and Sexual Activity    Alcohol use: Not on file    Drug use: Not on file    Sexual activity: Not on file   Other Topics Concern    Not on file   Social History Narrative    Born and raised:[place], moved to [place] end of 2019    Pets:6 cats in his household    Coffee:1 cup daily    Carbonated beverages:less than once a week, special events only        Dinner: "lunch" is around 5-6, 11 at night    ZOX:WRUEAV 2 am    Snacks?:something small so that he can take his medication     Social Determinants of Health     Financial Resource Strain: Not on file   Food Insecurity: Unknown  (01/29/2022)    Received from [employer] [facility]     In the past 3 months, have you had to go without food for 24 hours, multiple times due to lack of resources?: Not on file   Transportation Needs: Unknown (01/29/2022)    Received from [employer] [facility]eds     In the past 3 months, has lack of transporation kept you from medical appointments or getting things you need that are essential to your health?: Not on file   Physical Activity: Sufficiently Active (11/20/2017)    Received from [facility]    Exercise Vital Sign     Days of Exercise per Week: 4 days     Minutes of Exercise per Session: 60 min   Stress: Not on file   Social Connections: Unknown (03/20/2022)    Received from [facility]    Social Connections     In the past 3 months, do you feel that you lack companionship or social support?: Not on file   Intimate Partner Violence: Not on file   Housing Stability: Not on file     Review of Systems:  Patient at normal state of health and no current symptoms    Physical Exam:   Vitals: BP 138/78 (BP Location: Right arm, BP Patient Position: Sitting, BP cuff size: Regular)   Pulse 99   Temp 98.4 F (36.9 C) (Temporal Artery)     Physical Exam  General: WN WD NAD, A+Ox3, perrla, speech clear  Cardiovascular: S1, S2  RRR,  with no murmurs, rubs, or gallops  Pulmonary: No respiratory distress or labored breathing  GU: deferred    Musculoskeletal:   Shoulder:  Left   Normal posture.  Shoulder: No swelling.  Range of Motion :  Limited range of motion secondary to pain.  Forward flexion 1300.  External rotation 30.  Internal rotation hip pocket.  He is apprehensive during range of motion exam.  Contralateral shoulder : Forward flexion 180. External Rotation: 60. IR L1  Tenderness: No tenderness at the [facility] joint, biceps, anterior, posterior joint.  Strength: 5-/5  Supraspinatus, 5/5 External Rotators, 5/5 Subscapularis  Impingement Signs:  Deferred  Stability:  Positive  apprehension.  Positive relocation.  He guards significantly such that load and shift is difficult.  Sensation intact to light touch in the bilateral upper extremities.  2+ radial pulses    Imaging:   Xrays:  Radiographs performed at imaging healthcare specialist January 2024 reveal good alignment and no advanced osteoarthritis.  MRI:  The patient's noncontrast MRI of the left shoulder performed February 24, 2022 reveals likely anterior labral tear with intact rotator cuff.  There is evidence of a Hill-Sachs lesion with with likely remote injury to the posterior aspect of the glenoid.  The Hill-Sachs lesion is moderate in size.  There was no glenoid bone loss.  CT scan of the left shoulder shows a moderate to large Hill-Sachs lesion    Assessment/Plan  Preoperative history and physical exam completed    This is a 32 year old male  scheduled for LEFT SHOULDER ARTHROSCOPY, LABRAL REPAIR, CAPSULAR PLICATION, POSSIBLE REMPLISSAGE, POSSIBLE OPEN LATARJET PROCEDURE  .     Surgical consent was signed today in clinic.     The patient was prescribed their post op medication to include oxycodone, Naprosyn, Docusate and Zofran.    We had a further discussion regarding operative versus nonoperative treatment. The patient would like to proceed with operative treatment. We discussed the details of surgery as well as the rehabilitation today. We also discussed the risks of surgery including risk of anesthesia, blood loss, infection, DVT, incomplete pain relief, no pain relief, stiffness, neurovascular injury, reinjury or retear. All questions were answered.    A CURES report was run today if the patient received narcotic pain medication for greater than 5 days. The medications prescribed today were reviewed with Dr. Merilynn Finland and are in line with our usual prescribing practices for this procedure.    I spent time today in clinic reviewing the surgical procedure in detail.  I also spent time today in clinic reviewing outside  documentation in clinic notes from the post COVID clinic indicating that the patient is cleared to proceed with surgery.  I spent approximately 35 minutes with the patient today in clinic.

## 2022-11-03 NOTE — Telephone Encounter (Signed)
From: Gordon Huang  To: Melynda Keller, PA  Sent: 10/31/2022 4:12 PM PDT  Subject: Follow up surgery questions     Is there a chance you could give me something in writing that you recommend assistance for 7-10 days after my surgery? I am trying to get some financial assistance from my family and I am having Isaias Cowman, my emergency contact, take care of me. In order to assist properly he will need to take time off from work. He will not be able to do so unless he is compensated to ensure (our) rent and other financial obligations are paid.

## 2022-11-05 ENCOUNTER — Telehealth (HOSPITAL_BASED_OUTPATIENT_CLINIC_OR_DEPARTMENT_OTHER): Payer: Self-pay

## 2022-11-05 NOTE — Telephone Encounter (Signed)
Attempted to contact patient regarding evaluation appt scheduled tomorrow. left detailed message informing patient that Berkley Harvey has not been approved by his insurance, appointment is not financially secured and we will need to cancel the appointment and reschedule. Requested call back from patient to reschedule visit.     [employer]

## 2022-11-06 ENCOUNTER — Ambulatory Visit: Payer: Medicaid Other

## 2022-11-18 ENCOUNTER — Other Ambulatory Visit (HOSPITAL_BASED_OUTPATIENT_CLINIC_OR_DEPARTMENT_OTHER): Payer: Self-pay | Admitting: Sports Medicine

## 2022-11-18 ENCOUNTER — Encounter (HOSPITAL_BASED_OUTPATIENT_CLINIC_OR_DEPARTMENT_OTHER): Payer: Self-pay | Admitting: Sports Medicine

## 2022-11-18 DIAGNOSIS — Z419 Encounter for procedure for purposes other than remedying health state, unspecified: Secondary | ICD-10-CM

## 2022-11-18 NOTE — Anesthesia Preprocedure Evaluation (Signed)
ANESTHESIA PRE-OPERATIVE EVALUATION    Patient Information    Name: Gordon Huang    MRN: [id number]    DOB: 1990/08/25    Age: 32 year old    Sex: male  Procedure(s):  LEFT SHOULDER ARTHROSCOPY, LABRAL REPAIR, CAPSULAR PLICATION, POSSIBLE REMPLISSAGE, POSSIBLE OPEN LATARJET PROCEDURE      Pre-op Vitals:   There were no vitals taken for this visit.        Primary language spoken:  English    ROS/Medical History:      History of Present Illness:  Instability of left shoulder joint    General:  negative for General ROS   Cardiovascular:  negative cardio ROS     Anesthesia History:  negative anesthesia history ROS   Pulmonary:   negative pulmonary ROS     Neuro/Psych:   negative neuro/psych ROS  psychiatric history,   Hematology/Oncology:   hematologic/lymphatic negative      GI/Hepatic:  negative GI/hepatic ROS Infectious Disease:  no HIV (On [place] for long COVID),     Renal:  negative renal ROS   Endocrine/Other:  negative endo/other ROS,      Pregnancy History:   Pediatrics:         Pre Anesthesia Testing (PCC/CPC) notes/comments:                             Notes: Post-COVID chronic concentration deficit  Post-COVID chronic decreased mobility and endurance  Post-COVID chronic dyspnea  Post-COVID chronic fatigue  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)                 Physical Exam    Airway:     Inter-inciser distance > 4 cm  Prognanth Able    Mallampati: I  Neck ROM: full  TM distance: 4-5 cm  Short thick neck: No          Cardiovascular:  - cardiovascular exam normal    Rhythm: regular   Rate: normal          Pulmonary:  - pulmonary exam normal       breath sounds clear to auscultation          Neuro/Neck/Skeletal/Skin:  - Neck/Neuro/Skeletal/Skin exam normal          Dental:  - normal exam    Abdominal:   - normal exam     General: normal weight      Additional Clinical Notes:             Last OSA (STOP BANG) Score:   No data recorded    No past medical history on file.  No past surgical history on  file.  none     Socioeconomic History   . Marital status: Divorced   Tobacco Use   . Smoking status: Former     Average packs/day: 0.5 packs/day for 11.0 years (5.5 ttl pk-yrs)     Types: Cigarettes     Start date: 2006     Quit date: 2017     Years since quitting: 7.8     Passive exposure: Past   . Smokeless tobacco: Former   . Tobacco comments:     Mother smoked   Vaping Use   . Vaping status: Former   . Substances: Nicotine, THC   Social History Narrative    Born and raised:[place], moved to [place] end of 2019    Pets:6 cats in his household    Coffee:1  cup daily    Carbonated beverages:less than once a week, special events only        Dinner: "lunch" is around 5-6, 11 at night    ZOX:WRUEAV 2 am    Snacks?:something small so that he can take his medication     Social Determinants of Health      Received from [employer] [facility]    Received from [employer] [facility]   Physical Activity: Sufficiently Active (11/20/2017)    [facility]    Exercise Vital Sign    . Days of Exercise per Week: 4 days    . Minutes of Exercise per Session: 60 min    Received from [facility]    Social Connections     Alcohol Use: Not At Risk (05/15/2022)    Received from [facility]    Patient History    . Alcohol Use Status: Yes    . Oz/Week (Alcohol Misuse Male >= 11 OR Male >= 4.8): Not on file       Current Outpatient Medications   Medication Sig Dispense Refill   . (TRUVADA) emtricitabine 200 mg-tenofovir disoproxil 300 mg tablet Take 1 tablet by mouth daily.     Marland Kitchen buPROPion (WELLBUTRIN XL) 300 MG XL tablet Take 1 tablet (300 mg) by mouth daily.     Marland Kitchen docusate sodium (COLACE) 250 MG capsule Take 1 capsule (250 mg) by mouth 2 times daily. 20 capsule 0   . lamoTRIgine (LAMICTAL) 150 MG tablet Take 1 tablet (150 mg) by mouth 2 times daily.     Marland Kitchen LORazepam (ATIVAN) 1 MG tablet Take 1 tablet (1 mg) by mouth every 4 hours as needed for Anxiety.     . methylphenidate (CONCERTA) 54 MG  Controlled-Release tablet Take 1 tablet (54 mg) by mouth daily.     . naloxone (NARCAN) 4 mg/0.1 mL nasal spray FOR SUSPECTED OVERDOSE, call 911! Tilt head, spray one spray into one nostril as needed for respiratory depression. If patient does not respond or responds and then relapses, repeat using a new nasal spray TO THE OTHER NOSTRIL every 3 minutes until emergency medical assistance arrives. 2 each 0   . naltrexone (DEPADE) 50 MG tablet Take 1 tablet (50 mg) by mouth. Compound into 50mL of water and 50mg  = 4ml     . naproxen (NAPROSYN) 500 MG tablet Take 1 tablet (500 mg) by mouth 2 times daily (with meals). Okay to stop medication once pain and swelling well controlled. 40 tablet 0   . ondansetron (ZOFRAN ODT) 4 MG disintegrating tablet Take 1 tablet (4 mg) on or under the tongue every 8 hours as needed for Nausea/Vomiting. 10 tablet 0   . oxyCODONE (ROXICODONE) 5 MG immediate release tablet Take 1 tablet (5 mg) by mouth every 4 hours as needed for Severe Pain (Pain Score 7-10) (Okay to take 2 tabs po q4 hours for severe pain). 30 tablet 0     Allergies   Allergen Reactions   . Beef Extract Other and Itching     All Red-Meat Products   . Nsaids Itching       Labs and Other Data  No results found for: "NA", "SODIUM", "K", "CL", "BICARB", "BUN", "CREAT", "GLU", "[place]"  No results found for: "AST", "ALT", "GGT", "LDH", "ALK", "TP", "ALB", "TBILI", "DBILI"  No results found for: "WBC", "RBC", "HGB", "HCT", "MCV", "MCHC", "RDW", "PLT", "PLCTEL", "MPV", "MPVH", "SEG", "LYMPHS", "MONOS", "EOS", "BASOS"  No results found for: "INR", "PTT"  No results found for: "ARTPH", "ARTPO2", "ARTPCO2"    Anesthesia Plan:  Risks and Benefits of Anesthesia  I have personally performed an appropriate pre-anesthesia physical exam of the patient (including heart, lungs, and airway) prior to the anesthetic and reviewed the pertinent medical history, drug and allergy history, laboratory and imaging studies and consultations.   I have  determined that the patient has had adequate assessment and testing.  I have validated the documentation of these elements of the patient exam and/or have made necessary changes to reflect my own observations during my pre-anesthesia exam.  Anesthetic techniques, invasive monitors, anesthetic drugs for induction, maintenance and post-operative analgesia, risks and alternatives have been explained to the patient and/or patient's representatives.    I have prescribed the anesthetic plan:         Planned anesthesia method: General         ASA 2 (Mild systemic disease)     Potential anesthesia problems identified and risks including but not limited to the following were discussed with patient and/or patient's representative: Adverse or allergic drug reaction, Administration of blood products, Recall, Ocular injury, Dental injury or sore throat, Nerve injury, Injury to brain, heart and other organs, Death and Patient declined further  discussion of risks of anesthesia    No Beta Blocker Indicated:       Informed Consent:  Anesthetic plan and risks discussed with Patient and Spouse/Registered Media planner.    Plan discussed with OR Nurse and Surgeon.

## 2022-11-19 ENCOUNTER — Ambulatory Visit (HOSPITAL_BASED_OUTPATIENT_CLINIC_OR_DEPARTMENT_OTHER): Payer: Self-pay | Admitting: Anesthesiology

## 2022-11-19 ENCOUNTER — Encounter (HOSPITAL_BASED_OUTPATIENT_CLINIC_OR_DEPARTMENT_OTHER): Admission: RE | Disposition: A | Discharge status: Home Health | Payer: Self-pay | Attending: Sports Medicine

## 2022-11-19 ENCOUNTER — Other Ambulatory Visit: Payer: Self-pay

## 2022-11-19 ENCOUNTER — Encounter (HOSPITAL_COMMUNITY): Payer: Self-pay

## 2022-11-19 ENCOUNTER — Encounter (HOSPITAL_BASED_OUTPATIENT_CLINIC_OR_DEPARTMENT_OTHER): Payer: Self-pay | Admitting: Sports Medicine

## 2022-11-19 ENCOUNTER — Ambulatory Visit (HOSPITAL_BASED_OUTPATIENT_CLINIC_OR_DEPARTMENT_OTHER): Payer: Medicaid Other

## 2022-11-19 ENCOUNTER — Ambulatory Visit: Payer: Medicaid Other

## 2022-11-19 ENCOUNTER — Ambulatory Visit
Admission: RE | Admit: 2022-11-19 | Discharge: 2022-11-19 | Disposition: A | Discharge status: Home / Self Care | Payer: Medicaid Other | Attending: Sports Medicine | Admitting: Sports Medicine

## 2022-11-19 DIAGNOSIS — Z419 Encounter for procedure for purposes other than remedying health state, unspecified: Secondary | ICD-10-CM

## 2022-11-19 DIAGNOSIS — Z79899 Other long term (current) drug therapy: Secondary | ICD-10-CM | POA: Insufficient documentation

## 2022-11-19 DIAGNOSIS — Z791 Long term (current) use of non-steroidal anti-inflammatories (NSAID): Secondary | ICD-10-CM | POA: Insufficient documentation

## 2022-11-19 DIAGNOSIS — Z886 Allergy status to analgesic agent status: Secondary | ICD-10-CM | POA: Insufficient documentation

## 2022-11-19 DIAGNOSIS — G8918 Other acute postprocedural pain: Secondary | ICD-10-CM | POA: Insufficient documentation

## 2022-11-19 DIAGNOSIS — Z87891 Personal history of nicotine dependence: Secondary | ICD-10-CM | POA: Insufficient documentation

## 2022-11-19 DIAGNOSIS — M25312 Other instability, left shoulder: Secondary | ICD-10-CM

## 2022-11-19 DIAGNOSIS — S42292A Other displaced fracture of upper end of left humerus, initial encounter for closed fracture: Secondary | ICD-10-CM

## 2022-11-19 DIAGNOSIS — S43431A Superior glenoid labrum lesion of right shoulder, initial encounter: Secondary | ICD-10-CM

## 2022-11-19 DIAGNOSIS — Z79624 Long term (current) use of inhibitors of nucleotide synthesis: Secondary | ICD-10-CM | POA: Insufficient documentation

## 2022-11-19 SURGERY — ARTHROSCOPY, SHOULDER
Anesthesia: General | Site: Shoulder | Laterality: Left | Wound class: Class I (Clean)

## 2022-11-19 MED ORDER — LACTATED RINGERS IV SOLN
INTRAVENOUS | Status: DC
Start: 2022-11-19 — End: 2022-11-19

## 2022-11-19 MED ORDER — ONDANSETRON HCL 4 MG/2ML IV SOLN
INTRAMUSCULAR | Status: DC | PRN
Start: 2022-11-19 — End: 2022-11-19
  Administered 2022-11-19: 4 mg via INTRAVENOUS

## 2022-11-19 MED ORDER — OXYCODONE HCL 5 MG OR TABS
5.0000 mg | ORAL_TABLET | ORAL | Status: DC | PRN
Start: 2022-11-19 — End: 2022-11-19

## 2022-11-19 MED ORDER — ONDANSETRON HCL 4 MG/2ML IV SOLN
4.0000 mg | Freq: Once | INTRAMUSCULAR | Status: DC | PRN
Start: 2022-11-19 — End: 2022-11-19

## 2022-11-19 MED ORDER — DIPHENHYDRAMINE HCL 50 MG/ML IJ SOLN
12.5000 mg | Freq: Once | INTRAMUSCULAR | Status: DC | PRN
Start: 2022-11-19 — End: 2022-11-19

## 2022-11-19 MED ORDER — LIDOCAINE HCL 2 % IJ SOLN WRAPPED RECORD
INTRAMUSCULAR | Status: DC | PRN
Start: 2022-11-19 — End: 2022-11-19
  Administered 2022-11-19: 10 mL via PERINEURAL

## 2022-11-19 MED ORDER — MIDAZOLAM HCL 2 MG/2ML IJ SOLN
INTRAMUSCULAR | Status: DC | PRN
Start: 2022-11-19 — End: 2022-11-19
  Administered 2022-11-19: 2 mg via INTRAVENOUS

## 2022-11-19 MED ORDER — MIDAZOLAM HCL 2 MG/2ML IJ SOLN
INTRAMUSCULAR | Status: AC
Start: 2022-11-19 — End: 2022-11-19
  Filled 2022-11-19: qty 2

## 2022-11-19 MED ORDER — BUPIVACAINE-EPINEPHRINE (PF) 0.25% -1:200000 IJ SOLN
INTRAMUSCULAR | Status: AC
Start: 2022-11-19 — End: 2022-11-19
  Filled 2022-11-19: qty 30

## 2022-11-19 MED ORDER — PROPOFOL 200 MG/20ML IV EMUL
INTRAVENOUS | Status: DC | PRN
Start: 2022-11-19 — End: 2022-11-19
  Administered 2022-11-19: 80 mg via INTRAVENOUS
  Administered 2022-11-19: 200 mg via INTRAVENOUS

## 2022-11-19 MED ORDER — NALOXONE HCL 0.4 MG/ML IJ SOLN
0.1000 mg | INTRAMUSCULAR | Status: DC | PRN
Start: 2022-11-19 — End: 2022-11-19

## 2022-11-19 MED ORDER — BACITRACIN 500 UNIT/GM EX OINT (CUSTOM)
TOPICAL_OINTMENT | CUTANEOUS | Status: DC | PRN
Start: 2022-11-19 — End: 2022-11-19
  Administered 2022-11-19: 1 via TOPICAL

## 2022-11-19 MED ORDER — FENTANYL CITRATE (PF) 250 MCG/5ML IJ SOLN
INTRAMUSCULAR | Status: DC | PRN
Start: 2022-11-19 — End: 2022-11-19
  Administered 2022-11-19: 100 ug via INTRAVENOUS

## 2022-11-19 MED ORDER — MEPERIDINE HCL 25 MG/ML IJ SOLN
12.5000 mg | INTRAMUSCULAR | Status: DC | PRN
Start: 2022-11-19 — End: 2022-11-19

## 2022-11-19 MED ORDER — SODIUM CHLORIDE 0.9 % IR SOLN
Status: DC | PRN
Start: 2022-11-19 — End: 2022-11-19
  Administered 2022-11-19: 3000 mL

## 2022-11-19 MED ORDER — ROPIVACAINE HCL 2 MG/ML LARGE VOLUME OUTPATIENT USE ONLY (~~LOC~~)
INTRAMUSCULAR | Status: DC
Start: 2022-11-19 — End: 2022-11-19
  Filled 2022-11-19: qty 500

## 2022-11-19 MED ORDER — FENTANYL CITRATE (PF) 100 MCG/2ML IJ SOLN
INTRAMUSCULAR | Status: AC
Start: 2022-11-19 — End: 2022-11-19
  Filled 2022-11-19: qty 2

## 2022-11-19 MED ORDER — LIDOCAINE HCL (PF) 1 % IJ SOLN
0.1000 mL | Freq: Once | INTRAMUSCULAR | Status: DC | PRN
Start: 2022-11-19 — End: 2022-11-19

## 2022-11-19 MED ORDER — ACETAMINOPHEN 325 MG PO TABS
975.0000 mg | ORAL_TABLET | Freq: Once | ORAL | Status: AC
Start: 2022-11-19 — End: 2022-11-19
  Administered 2022-11-19: 975 mg via ORAL
  Filled 2022-11-19: qty 3

## 2022-11-19 MED ORDER — CEFAZOLIN SODIUM 1 GM IJ SOLR
INTRAMUSCULAR | Status: DC | PRN
Start: 2022-11-19 — End: 2022-11-19
  Administered 2022-11-19: 2000 mg via INTRAVENOUS

## 2022-11-19 MED ORDER — BUPIVACAINE-EPINEPHRINE (PF) 0.25% -1:200000 IJ SOLN
INTRAMUSCULAR | Status: DC | PRN
Start: 2022-11-19 — End: 2022-11-19
  Administered 2022-11-19: 20 mL

## 2022-11-19 MED ORDER — FENTANYL CITRATE (PF) 50 MCG/ML IJ SOLN (WRAPPED RECORD) ~~LOC~~
50.0000 ug | INTRAMUSCULAR | Status: DC | PRN
Start: 2022-11-19 — End: 2022-11-19

## 2022-11-19 MED ORDER — DEXAMETHASONE SODIUM PHOSPHATE 4 MG/ML IJ SOLN (CUSTOM)
INTRAMUSCULAR | Status: DC | PRN
Start: 2022-11-19 — End: 2022-11-19
  Administered 2022-11-19: 6 mg via INTRAVENOUS

## 2022-11-19 MED ORDER — LACTATED RINGERS IV SOLN
INTRAVENOUS | Status: DC | PRN
Start: 2022-11-19 — End: 2022-11-19

## 2022-11-19 MED ORDER — AMISULPRIDE (ANTIEMETIC) 10 MG/4ML IV SOLN
10.0000 mg | Freq: Once | INTRAVENOUS | Status: DC | PRN
Start: 2022-11-19 — End: 2022-11-19

## 2022-11-19 MED ORDER — MENTHOL 3 MG MT LOZG
1.0000 | LOZENGE | OROMUCOSAL | Status: DC | PRN
Start: 2022-11-19 — End: 2022-11-19

## 2022-11-19 MED ORDER — HYDROMORPHONE HCL 1 MG/ML IJ SOLN
0.5000 mg | INTRAMUSCULAR | Status: DC | PRN
Start: 2022-11-19 — End: 2022-11-19

## 2022-11-19 MED ORDER — BACITRACIN ZINC 500 UNIT/GM EX OINT
TOPICAL_OINTMENT | CUTANEOUS | Status: AC
Start: 2022-11-19 — End: 2022-11-19
  Filled 2022-11-19: qty 15

## 2022-11-19 MED ORDER — LIDOCAINE HCL 2 % IJ SOLN WRAPPED RECORD
INTRAMUSCULAR | Status: DC | PRN
Start: 2022-11-19 — End: 2022-11-19
  Administered 2022-11-19 (×2): 40 mg via INTRATHECAL

## 2022-11-19 SURGICAL SUPPLY — 61 items
3M STERI-DRAPE 1000 (Drapes/towels) ×1 IMPLANT
ADAPTER DUAL SPIKE FOR SIMPULSE SUCTION/IRRIGATOR (Tubing/Suction) ×2 IMPLANT
ANCHOR 2.6MM KNOTLESS FIBERTAK (Artificial joint component- shoulder/elbow/finger) ×2 IMPLANT
APPLICATOR CHLORAPREP 26ML, ~~LOC~~ (Prep Solutions) ×2 IMPLANT
BANDAGE COBAN 6 STERILE LATEX FREE (Dressings/packing) ×1 IMPLANT
BLADE ARTHROSCOPY FORMULA 4.0MM RESECTOR (Knives/Blades) ×1 IMPLANT
BLADE ARTHROSCOPY FORMULA 5.5MM RESECTOR (Knives/Blades) IMPLANT
BLADE INTRAORAL FLAIR TAPPER (Knives/Blades) ×1 IMPLANT
BLADE SURGEON #15 STERILE (Knives/Blades) ×2 IMPLANT
CANNULA ARTHROSCOPIC TWIST-IN 8.25MM X 7CM (Lap/Endo/Arthroscopy) ×2 IMPLANT
CANNULA ARTHROSCOPIC UNIVERSAL 5.5 X 70MM W/O FENETRATIONS (Lap/Endo/Arthroscopy) ×2 IMPLANT
CANNULA TWIST IN 6 MM X 7 CM BX/5 (Needles/punch/cannula/biopsy) ×3 IMPLANT
CAUTERY BOVIE ROCKER SWITCH PENCIL (Cautery) ×1 IMPLANT
DRAPE HOLOGIC FLUOROSCAN (Drapes/towels) ×1 IMPLANT
DRAPE U IMPERVIOUS SPLIT FILM, 54" X 76" (Drapes/towels) ×1 IMPLANT
DRESSING ADAPTIC 3X3 STERILE (Dressings/packing) ×1 IMPLANT
DRESSING SPONGE CURITY 4X4 16 PLY (Dressings/packing) ×1 IMPLANT
DRESSING TEGADERM HP 4X4 (Dressings/packing) ×9 IMPLANT
DRESSING XEROFORM STRL 1X8 (Dressings/packing) ×1 IMPLANT
FIBERTAK DOUBLE LOADED W/1.3 SUTURE TAPE (Patch/Mesh) ×1 IMPLANT
FIBERTAK KIT CURVED DISPOSABLE (K-wires/fixation pins) ×1 IMPLANT
FIBERTAK SHOULDER SELF BUNCHING KL 1.8 (Screws/anchors/cables) ×4 IMPLANT
GLOVE BIOGEL INDICATOR UNDERGLOVE SIZE 6.5 (Gloves/Gowns) ×1 IMPLANT
GLOVE BIOGEL INDICATOR UNDERGLOVE SIZE 7 (Gloves/Gowns) ×1 IMPLANT
GLOVE BIOGEL PI INDICATOR SIZE 6.5 (Gloves/Gowns) ×2 IMPLANT
GLOVE BIOGEL PI INDICATOR SIZE 7.5 (Gloves/Gowns) ×1 IMPLANT
GLOVE BIOGEL PI LF SIZE 8 INDICATOR (Gloves/Gowns) ×1 IMPLANT
GLOVE BIOGEL PI ULTRATOUCH SIZE 7.5 (Gloves/Gowns) ×1 IMPLANT
GLOVE SURGEON BIOGEL SIZE 7.5 (Gloves/Gowns) ×1 IMPLANT
GLOVE SURGICAL BIOGEL SIZE 6 (Gloves/Gowns) ×2 IMPLANT
GLOVE SURGICAL BIOGEL SIZE 6.5 (Gloves/Gowns) ×4 IMPLANT
GOWN SIRUS XLG BLUE, AAMI LVL 4 (Gloves/Gowns) ×1 IMPLANT
IMMOBILIZER SHOULDER LARGE (Misc Medical Supply) ×1 IMPLANT
KIT DISPOSALBES FOR FIBERTAK SUTURE ANCHOR (Kits/Sets/Trays) IMPLANT
KIT RC DISPOSABLE FOR 2.6 FIBERTAK (Procedure Packs/kits) ×1 IMPLANT
LASSO ARTHREX SUTURE 25° LEFT TGT CRV (Suture) ×1 IMPLANT
MAT SUCTION LOW PROFILE 50"" X 34"" (Misc Medical Supply) ×1 IMPLANT
NEEDLE SPINAL 18GA X 3.5" (Needles/punch/cannula/biopsy) ×1 IMPLANT
NEEDLE SUTURE EXPRESSEW III FLEXIBLE (Needles/punch/cannula/biopsy) IMPLANT
PAD GROUND VALLEYLAB REM ADULT E7507 (Cautery) ×1 IMPLANT
SKIN PREP -BATH CLOTH CHG 2% (Prep Solutions) ×1 IMPLANT
SOLUTION IRR BAG .9% N/S 3000ML (Non-Pharmacy Meds/Solutions) ×6 IMPLANT
SOLUTION IRR POUR BTL 0.9% NS 1000ML (Non-Pharmacy Meds/Solutions) ×2 IMPLANT
SOLUTION IRR POUR BTL H20 1000ML (Non-Pharmacy Meds/Solutions) ×1 IMPLANT
SPONGE LAP RF DETECT 18" X 18" XRAY STERILE (Sponges) ×1 IMPLANT
STRIP MEDI-STRIP SKIN CLOSURE 1/2 X 4" (Dressings/packing) ×1 IMPLANT
SUPPORT MCCONNELL ARM 12-401 (Misc Surgical Supply) ×1 IMPLANT
SURGICAL PACK SHOULDER ARTHRO SAME DAY (Procedure Packs/kits) ×1 IMPLANT
SUTURE MONOCRYL 3-0 27" SH MCP316 (Suture) ×1 IMPLANT
SUTURE MONOCRYL 4-0 18" PS-2 (Y496) (Suture) ×1 IMPLANT
SUTURE MONOCRYL PLUS 2-0 27" SH MCP417 (Suture) ×1 IMPLANT
SUTURE MONOCRYL PLUS 3-0 27" PS-2 (MCP427) (Suture) ×2 IMPLANT
SUTURE PDS PLUS 0 27" CT-1 VIOLET (Suture) ×1 IMPLANT
SUTURE PDS PLUS 0 27" CT-2 (PDP334) (Suture) ×2 IMPLANT
SUTURE VICRYL+ 0 27" CP-2 (VCP870) (Suture) ×1 IMPLANT
TOWELS OR BLUE 4-PACK STERILE, DISPOSABLE (Drapes/towels) ×1 IMPLANT
TUBE YANKAUER FLEXIBLE TIP (Tubing/Suction) ×1 IMPLANT
TUBING ARTHROSCOPY FLOSTEADY INTEGRATED (Tubing/Suction) ×1 IMPLANT
TUBING CROSSFLOW INFLOW CASSETTE (Tubing/Suction) ×1 IMPLANT
WAND ARTHROWAND 3.5MM 90° STARVAC (Lap/Endo/Arthroscopy) ×1 IMPLANT
WAND WEREWOLF FLOW 90 COBLATION (Laser Fibers/Probes) ×1 IMPLANT

## 2022-11-19 NOTE — Op Note (Signed)
OPERATIVE REPORT        Pre-op Dx:     Left shoulder instability, anterior labral tear, Hill-Sachs lesion.     Post-op Dx:   Same     Procedure:    Left shoulder arthroscopy, anterior labral repair, capsulorrhaphy,  remplissage that is repair of infraspinatus tendon into Hill-Sachs lesion.     DOS:   November 19, 2022     Surgeon: Blenda Bridegroom, MD     Assistant:   Barb Merino, Pac.  No resident assistant available.     Anest: General     EBL:  Minimal     Indication:    The patient is a 32 year old male who has  had a long history of left shoulder instability.  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  Unfortunately he sustained a sizable Hill-Sachs lesion which has made his instability more severe.  He has had extensive nonsurgical care without benefit. We discussed treatment options in detail.  The patient elected to proceed with surgical care.    He is aware that surgery will address instability but may not have significant effects on pain.Risks, benefits, alternatives, and post-operative treatment course were reviewed. Informed consent was confirmed. The patient's questions were answered.     Procedure:     The patient was appropriately identified and marked the preoperative area.  They were brought to the operating room where general anesthesia was induced.  The patient was placed in the beach chair position with the head and neck carefully padded and protected.  The surgical upper extremity was prepped and draped in sterile fashion.  Prior to incision, time-out was done.       We began with an exam under anesthesia.    The patient had full motion.  He had 2 to 3+ anterior laxity and 1 to 2+ posterior laxity.     We then marked all incisions.  I began by making a posterior portal to the glenohumeral joint.  I placed the scope within the shoulder joint.  I then triangulated to place 2 anterior portals with an 8 mm and 6 mm cannula.  On diagnostic arthroscopy,   The patient had some  mild grade 1 changes to the articular cartilage without focal deficit.  The anterior labrum was torn from the 2 o'clock to 6 o'clock position anteriorly.  The inferior and posterior labrum were intact.  The superior labrum was intact.  The biceps tendon was normal.  The rotator cuff was normal.  The axillary pouch was normal.  The patient had a moderate to large Hill-Sachs lesion at the posterior superior humeral head.   There was no significant glenoid bone loss.       Based on the patient's labral tissue as well as the size of the Hill-Sachs lesion, I elected to proceed with remplissage.  Therefore I moved the scope to the anterior position.  I positioned the arm to give good visualization of the Hill-Sachs lesion.  I then placed a 6 mm cannula posteriorly.  I used a curette as well as a shaver to debride the Hill-Sachs lesion.  I then placed a 2nd posterior cannula which was lateral to the original portal.  I placed a 6 mm cannula and advanced this until it was just under the capsule and infraspinatus.  I then percutaneously placed two 2.6 mm Arthrex FiberTak anchors.  One was placed rather inferiorly in the lesion and 1 superiorly.  These seated nicely with good bite.  I then used  a hemostat to isolate the sutures from each anchor.  Due to the size of the Hill-Sachs lesion, I placed a 3rd anchor at the very superior extent of the Hill-Sachs lesion.  This was a 1.8 mm Arthrex FiberTak, double loaded.  This seated nicely as well.  I then used a spinal needle and a PDS from the subacromial base to shuttle the 4 sutures from the more superior anchor through the rotator cuff.  These were then set aside.    After doing the preparatory work for the remplissage, I carefully probed the labrum.  I used a liberator to free up the anterior labrum and ensure that it could be nicely reduced.  I then placed a total 4 Arthrex knotless FiberTak anchors starting at the 6 o'clock position and extending to the 5:00., 4:00., 3:00  positions.  Each anchor was placed sequentially.  The inserter was placed and malleted down for stability.  A drill was used.  The anchor was placed and malleted into place.  The inserter was removed.  The anchor was tug tested for stability check.  We then used a suture Lasso to take a isolated bite of labrum and capsule.  This was then retrieved through the secondary anterolateral portal.  The Nitinol wire was used to shuttle the blue suture from the anchor around labrum.  The blue suture was then brought in through the locking mechanism the anchor and pulled tight.  This nicely reduced the labrum and capsule up against the anterior glenoid.  We sequentially tightened and then cut our sutures.  Once we had our fourth anchor in place, I was satisfied with the anterior capsulorrhaphy and labral repair.      We then moved the camera back to the anterior superior position to finish the remplissage.   We placed the blue suture from the inferior FiberTak into the loop suture of the superior FiberTak and vice versa.  We were then able to pass our sutures through the locking mechanism of the anchor.  These were tightened down under direct visualization.  We ensured that the infraspinatus tissue nicely filled the exposed bone of the Hill-Sachs lesion.  This nicely centered our humeral head on the glenoid.     We then moved to the subacromial space.  We identified and retrieved the final sutures from the remplissage.  Each pair was tied down of the rotator cuff using standard knot-tying technique.  We then cut our suture in short.    At this point we carefully irrigated.  We took final arthroscopic photos.  We were satisfied with the repair.  All arthroscopic instruments were removed.  The portals were closed with Monocryl sutures.  Steri-Strips, local anesthetic, and a sterile dressing were placed.  We placed a shoulder immobilizer.      The patient tolerated the procedure well. There were no apparent intraoperative  complications. The patient was extubated uneventfully and taken to the post operative care unit in stable condition.  I was present for the entirety of the procedure.      Blenda Bridegroom, MD  11/19/22

## 2022-11-19 NOTE — Anesthesia Procedure Notes (Signed)
Regional Anesthesia Block Procedure Note  Date & Time: 11/19/2022 7:57 AM    Short Procedural Summary (Full description of each separate nerve block  below):  Procedure #1 -  Interscalene.    Laterality - Left.    Block type - Continuous catheter.                          Medications Administrations:  Medications Administered at:  11/19/2022 7:57 AM    Indications  Procedure performed to address pain in the Shoulder.  Laterality:  Left  Patient seen and examined in the PreOp area.  This procedure was performed at the request of the referring physician for post operative pain control.  Referred by attending surgeon        Universal protocol  Verbal consent obtained, written consent obtained, risks and benefits discussed, patient states understanding of the procedure being performed, the patient's understanding of the procedure matches consent given, procedure consent matches procedure scheduled, relevant documents present and verified, test results available and properly labeled, site marked, imaging studies available, required blood products, implants, devices, and special equipment available and Immediately prior to procedure a time out was called to verify the correct patient, procedure, equipment, support staff and site/side marked as required  Consent given by: patient  Patient identity confirmed by: verbally with patient, arm band, provided demographic data and hospital-assigned identification number  H&P: H&P Reviewed  Vital signs monitored: Yes  Sterile skin prep: Choloraprep (Chorahexadine)  O2 administered: Face Mask    Patient Instructions    Gave patient written post-procedure instructions and contact information for the regional anesthesia service    Block Procedure #1  Block procedure type: Interscalene  Procedure laterality: Left    Nerve block:  Continuous catheter  Needle gauge to anesthetize skin entry site: 25G  Needle gauge used to perform procedure: 17g and Touhy  Block technique: Ultrasound  guided  Ultrasound guidance used to identify relevant anatomy.  Ultrasound guidance used to visualize local anesthetic spread around nerve(s).  Ultrasound guidance used to guide needle to targeted nerve and avoid vascular puncture.    Catheter type: Non-stimulating  Brief medication summary  IV sedation as documented on MAR. See MAR for full medication details  Local anesthetic medication: Lidocaine    Local anesthetic volume: 10 mL  Complications: None      I, Duanne Limerick, MD, was present for the entire procedure.    Duanne Limerick, MD  11/19/2022 8:46 AM    Block Procedure #2      Block Procedure #3      Block Procedure #4      Block Procedure #5      Block Procedure #6

## 2022-11-19 NOTE — H&P (Signed)
HISTORY & PHYSICAL - INTERVAL ASSESSMENT    **ONLY TO BE USED IN ADDITION TO A HISTORY & PHYSICAL**    Gordon Huang  [id number]      This interval H&P update references the history and physical documentation from this date:  10/31/22    Current Medical Status:  Unchanged    Medications / Allergies:  Unchanged    Review of Systems:  Unchanged    Physical Examination:  BP 115/77 (BP Location: Left arm, BP Patient Position: Sitting)   Pulse 90   Temp 96.8 F (36 C)   Resp 14   Ht 5\' 8"  (1.727 m)   Wt 71.7 kg (158 lb 1.1 oz)   SpO2 98%   BMI 24.03 kg/m   I have examined the patient today.  Unchanged    Laboratory or Clinical Data:  No results found for: "CO19", "COV19RAASSAY"  No results found for: "NA", "K", "CL", "BICARB", "BUN", "CREAT", "GLU", "[place]"  No results found for: "WBC", "RBC", "HGB", "HCT", "MCV", "MCHC", "RDW", "PLT", "MPV", "SEG", "LYMPHS", "MONOS", "EOS", "BASOS"  No results found for: "AST", "ALT", "GGT", "LDH", "ALK", "TP", "ALB", "TBILI", "DBILI"  No results found for: "INR", "PTT"      Modifications of Initial Care Plan:  The care plan has been discussed with the attending physician of record and remains unchanged. This was discussed with the patient today on the morning of the procedure. The patient understands the current plan and verbally consents to the proposed treatment.     Fredia Sorrow     11/19/22     7:37 AM

## 2022-11-19 NOTE — Discharge Instructions (Signed)
Gordon Huang. Merilynn Finland, MD  Department of Orthopaedic Surgery, Sports Medicine  [facility], [place]:   [phone]    Post-Operative Instructions  Shoulder Arthroscopy- Labral Repair & Remplissage    BANDAGES: You may remove the outer dressing and gauze pads after 7-10 days. You may cover the sutures loosely with a Band-Aid. Keep your sutures clean and dry.     SHOWER: If you wish to shower, you can do so on post op day # 2-3. Keep surgical dressing in place while you shower. If the dressing gets moist then remove the dressing after the shower and dry the area well before placing Band-Aids on the incisions. For subsequent showers use a plastic bag or saran wrap to keep your shoulder dry. Your sutures will be removed approximately 10-14 days after surgery.     SLING: The sling is to be worn AT ALL TIMES for 4-6 weeks EXCEPT to shower, get dressed and when in Physical Therapy. Do not try to actively lift your arm- this can cause strain on your repair.  You CAN move your elbow, wrist, & hand.     ICE: Ice will be placed on your shoulder in the recovery room. Leave it on as much as possible for the first 3 days. After 3 days, use it for a minimum of 4 times a day for 30 minutes. The more you use it, the less swelling and inflammation you will have.  Do not place ice directly on your skin: it can cause burns.    ACTIVITY: Rest the day of surgery. You may place a pillow under your forearm for comfort. It may be easier to sleep sitting up for the first few days.     DRIVING: No driving is allowed while you are wearing the sling.     RETURNING TO WORK OR SCHOOL: You may return to work (sedentary) or school 5-7 days after surgery if pain is tolerable. Returning to heavy labor will be determined by Dr. Merilynn Finland.    POST OPERATIVE VISIT: You should already have a post-operative visit scheduled.  If not call the office after surgery to make an appointment 10-14 days after surgery.        PHYSICAL  THERAPY: We will arrange for you to see a physical therapist for rehabilitation.  We will not start therapy until after your first post op appointment.  You will receive instructions to give to the physical therapist.    If you develop a fever (101.5), redness or drainage from the surgical incision site, please CALL [phone]) our office to arrange for an evaluation. DO NOT SEND MYCHART MESSAGE.    PRESCRIPTIONS: You will receive prescriptions for medication to be used after surgery: these may include a narcotic pain medication, an anti-inflammatory, and/or an anti-nausea medication, and/or stool softener.  Narcotic Pain Medication: Percocet or Norco  Initially after surgery, you can take 1-2 tablets of the narcotic pain medication every 4-6 hours depending on your pain level. Once your pain is well controlled, you can taper off the medication.  Generally used for 1-2 weeks after surgery  May cause constipation and/or nausea  Anti-inflammatory Medication: Naprosyn/Naproxen or Motrin/Ibuprofen  If you have any difficulty using anti-inflammatory medications or aspirin, or have a history of ulcer disease, do not use.  You may use over the counter ibuprofen (Advil) or Naprosyn (Aleve) instead of the prescription we give you.  Anti-nausea Medication: Compazine or Zofran  If you have nausea after surgery, call the office so that  we can provide appropriate medication.  Stool Softener: Colace (Docusate)  The narcotic pain medication will cause constipation. Take stool softener as directed while taking narcotic pain medication unless you develop diarrhea.      Every patient is different: If Dr. Merilynn Finland gives you individual instructions (even if they are different from this handout), please follow them.  Phone: [phone]    **If you have any questions, please feel free to call our office.**

## 2022-11-19 NOTE — Anesthesia Postprocedure Evaluation (Signed)
Anesthesia Post Note    Patient: Gordon Huang    Procedure(s) Performed: Procedure(s):  LEFT SHOULDER ARTHROSCOPY, LABRAL REPAIR, CAPSULAR PLICATION, REMPLISSAGE      Final anesthesia type: General    Patient location: PACU    Post anesthesia pain: adequate analgesia    Mental status: awake, alert , and oriented    Airway Patent: Yes    Last Vitals:   Vitals Value Taken Time   BP 115/77 11/19/22 1200   Temp 36.1 C 11/19/22 1200   Pulse 71 11/19/22 1200   Resp 15 11/19/22 1200   SpO2 96 % 11/19/22 1200        Post vital signs: stable    Hydration: adequate    N/V:no    Anesthetic complications: no    Plan of care per primary team.

## 2022-11-19 NOTE — Progress Notes (Signed)
Evalee Mutton. Merilynn Finland, MD  Department of Orthopaedic Surgery, Sports Medicine  [facility], [place]: [phone]  [place] [address] Office, [place]: [phone]    Patient Name:     Date:     PHYSICAL THERAPY PRESCRIPTION:  S/P ANTERIOR LABRAL REPAIR and POSTERIOR  ARTHROSCOPIC SHOULDER WITH REMPLISSAGE    RECOVERY / RECUPERATION PHASE:  WEEKS 1 - 3   * Immobilization in sling x3-4 weeks except for exercises   * Elbow A/AAROM: flexion and extension.   * Protect ant. capsule from stretch. Limit IR to neutral, Horiz ABD, to scapular plane.   * Modalities (i.e. CryoCuff) PRN.   * Wrist and gripping exercises.  * Deltoid isometrics.   * Grip strengthening  WEEKS 3 - 6   * At 3 weeks PROM: pulley for flexion, pendulum exercises.   * Pool exercises: A/AAROM flexion, extension, horiz. addctn, elbow flex & extension.   * Deltoid isometrics.   * Lightly resisted elbow flexion.   * Continue with wrist ex.   * Modalities PRN.   * Discontinue sling at 4-6 weeks.  WEEKS 6 - 12   * 6-10 weeks, gradual A/AA/PROM to improve IR with arm at side.   * Progress flexion to tolerance.   * 10-12 weeks, A/AA/PROM to improve IR/ER   * Pool exercises AROM all directions below horizontal, light resisted motions in all planes.   * AROM activities to restore flexion, IR, ER, horiz ADD.   * Deltoid, Rotator Cuff isometrics progressing to isotonics.   * PRE's for scapular muscles, latissimus, biceps, triceps.   * PRE's work rotators in isolation (use modified neutral).   * Joint mobilization (posterior glides).   * Emphasize posterior cuff, latissimus, & scapular muscle strengthen, stress eccentrics.   * Utilize exercise arcs that protect anterior capsule from stress during PRE's.   * Keep all strength exercises below the horizontal plane in this phase.  WEEKS 12 - 16   * AROM activities to restore full ROM.   * Restore scapulohumeral rhythm.   * Joint mobilization.   * Aggressive scapular stabilization and eccentric  strengthening program.   * PRE's for all upper quarter musculature (begin to integrate upper extremity   patterns). Continue to emphasize eccentrics and glenohumeral stabilization. All PRE's  are  below the horizontal plane for non-throwers.   * Begin isokinetics.  WEEKS 16 - 24   * Begin muscle endurance activities (UBE).   * Continue with agility exercises.   * Advanced functional exercises.   * Isokinetic test.   * Functional test assessment.   * Full return to sporting activities.

## 2022-11-19 NOTE — Addendum Note (Signed)
Addendum  created 11/19/22 2104 by Jene Every, MD    Clinical Note Signed

## 2022-11-19 NOTE — Plan of Care (Signed)
Problem: Promotion of Perioperative Health and Safety  Goal: Promotion of Health and Safety of the Perioperative Patient  Description: The patient remains safe, receives treatment appropriate to the surgical intervention and patient's physiological needs and is discharged or transferred to the appropriate level of care.    Information below is the current care plan.  Outcome: Progressing  Flowsheets (Taken 11/19/2022 1110)  Guidelines: PACU  Individualized Interventions/Recommendations #1: Patient repositioned for comfort.

## 2022-11-20 NOTE — Anesthesia Follow Up (Signed)
Regional Anesthesia Telephone Encounter Follow-Up Note  Peripheral Nerve Block    History of Present Illness:     Gordon Huang is a 32 year old male who is POD 1, catheter day 2 status post left shoulder arthroscopy. The patient had a left interscalene peripheral nerve block placed for post-operative pain/intraoperative anesthesia.     The infusion contains ropivicaine 0.2% running at 8 mls/hour continuous, and 4 ml bolus with lockout of 30 minutes.    Regarding the pain, patient reports pain score: Uncontrolled. He says the numbness/tingling wore off around midnight last night and since then he hasn't had much analgesia at all. He is taking oxycodone.    Patient reports:  Catheter is in situ  Nerve block site is clean and dry without erythema, induration or tenderness to palpation  Sensory block: not fully covering surgical incision  Motor block: appropriate    Assessment:  This is a 32 year old male with post-operative/acute pain who has poor pain control with regional nerve block catheter.    Plan:   Continue current infusion. Advised to add tylenol to pain reigmen.  Patient advised to call [phone] and page the regional anesthesia fellow/resident on-call with any questions or concerns. Will follow up tomorrow.    Note Author: Alexia Freestone, MD

## 2022-11-20 NOTE — Addendum Note (Signed)
Addendum  created 11/20/22 1004 by Alexia Freestone, MD    Clinical Note Signed

## 2022-11-21 ENCOUNTER — Telehealth (INDEPENDENT_AMBULATORY_CARE_PROVIDER_SITE_OTHER): Payer: Self-pay | Admitting: Sports Medicine

## 2022-11-21 DIAGNOSIS — G8918 Other acute postprocedural pain: Secondary | ICD-10-CM

## 2022-11-21 DIAGNOSIS — R112 Nausea with vomiting, unspecified: Secondary | ICD-10-CM

## 2022-11-21 MED ORDER — MORPHINE SULFATE ER 15 MG PO TBCR
15.0000 mg | EXTENDED_RELEASE_TABLET | Freq: Two times a day (BID) | ORAL | 0 refills | Status: DC
Start: 2022-11-21 — End: 2022-12-01

## 2022-11-21 MED ORDER — PROMETHAZINE HCL 25 MG OR TABS
25.0000 mg | ORAL_TABLET | Freq: Four times a day (QID) | ORAL | 0 refills | Status: DC | PRN
Start: 2022-11-21 — End: 2022-12-01

## 2022-11-21 MED ORDER — PROCHLORPERAZINE MALEATE 10 MG OR TABS
10.0000 mg | ORAL_TABLET | Freq: Four times a day (QID) | ORAL | 1 refills | Status: DC | PRN
Start: 2022-11-21 — End: 2022-12-01

## 2022-11-21 NOTE — Anesthesia Follow Up (Signed)
Regional Anesthesia Telephone Encounter Follow-Up Note  Peripheral Nerve Block    History of Present Illness:     Shulem Suda is a 32 year old male who is POD 2, catheter day 3 status post left shoulder arthroscopy. The patient had a left interscalene peripheral nerve block placed for post-operative pain/intraoperative anesthesia.     The infusion contains ropivacaine 0.2% running at 8 mls/hour continuous, and 4 ml bolus with lockout of 30 minutes.    Regarding the pain, patient reports pain score: Unable to assess.    Patient reports:  Catheter is presumed in situ  Nerve block site is presumed clean and dry without erythema, induration or tenderness to palpation  Sensory block: unable to assess  Motor block: unable to assess    Assessment:  This is a 32 year old male with post-operative/acute pain who has unknown pain control with regional nerve block catheter.    Plan:   Continue current infusion.   Patient advised to call [phone] and page the regional anesthesia fellow/resident on-call with any questions or concerns.    Note Author: Doyle Askew, MD

## 2022-11-21 NOTE — Addendum Note (Signed)
Addendum  created 11/21/22 1638 by Chari Manning, MD    Clinical Note Signed

## 2022-11-21 NOTE — Telephone Encounter (Signed)
I spoke to the patient by phone and discussed his symptoms in detail.  It sounds like the pump is empty.  He has been taking oxycodone.  The oxycodone does help with the pain although he is having he is relatively high doses of 2-3 tablets.  He notes that he gets dysphoria when he takes this medication.  He did not really get nausea from this.  He noted that he had more nausea when he is taking the Zofran so he has stopped this.  No concern for re-injury or other complication based on our discussion.    We will start him on MS Contin 15 mg 1 tablet b.i.d..  He may take oxycodone 1 tablet to 2 tablet for be breakthrough.  He will stop the Zofran.  We will try Compazine for nausea.  I will touch base with him probably tomorrow.      If he takes these medications in his still could not get comfortable, I think it would be safest for him to go to the ER.  He understands.  All questions answered.

## 2022-11-21 NOTE — Telephone Encounter (Signed)
I was in touch with Dr. Merilynn Finland and reviewed the symptoms.  She will be contacting the patient to discuss things further.

## 2022-11-21 NOTE — Telephone Encounter (Signed)
ONLY Route symptoms message for Ortho ESTABLISHED PAT ONLY     Caller's Name:    Relationship to patient:Self    Interpreter needed:No    Current Pain Level (0 Lowest -10 highest): 10    Reason for call: (including the symptoms/concerns patient has): symptoms     Patient calling to advise the nerve block wore off around midnight last night, also with severe nausea, mention meds for it not working, he is not able to keep anything down. Has a lot of burping an hiccups. He is very concern his number  [phone].      Provider Name:Robertson, Santina Evans MD        If  Patient had surgery: (Procedure Name) (DOS)11/19/22 LEFT SHOULDER ARTHROSCOPY, LABRAL REPAIR, CAPSULAR PLICATION, REMPLISSAGE       LOV Date and Plan:12/01/22        Best time to call patient and/or Medical Care Facility, and best contact ph# (RN please document outbound call):            Reminder:  Symptom calls should be responded to within 24 hours by RN. If you need a more urgent callback to patient, please use RN chat group and/or page. Use messaging guidelines for red-flag symptoms. Please verify that your patient has received callback with-in the requested timeframe.

## 2022-11-21 NOTE — Telephone Encounter (Addendum)
RT to provider for review and advise.   ==========================================================  DOS: 11/19/22-LEFT SHOULDER ARTHROSCOPY, LABRAL REPAIR, CAPSULAR PLICATION, REMPLISSAGE   LOV: 10/31/22  NOV: 12/01/22    RN called patient. 2 pt IDs verified.    Patient reports:  Nerve block wore off early Thursday morning 10/24  Pain 10/10 L shoulder  Nausea/vomiting   Unable to hold food down except pretzels and crackers   "Dark pee."   Last BM: 11/18/22   Hiccups and acid reflux   Able to pass gas: yes  Abdominal pain: denies   Abdominal swelling: denies  Listed NSAID allergy. Pt states ibuprofen allergy only: rx itching   Tried calling [phone] number yesterday evening regarding s/sx but evidence of call not in chart    Patient Management:  Zofran 4mg  q 8 hrs sublingual-pt reports throwing up within 5 min of each zofran dose   Oxycodone 5mg  tablet-1-3 tablets q 4 hrs-"3 tablets does the job but I feel crappy."   Throwing up 45-59min after oxycodone dose  Cold therapy machine-"Helps and then hurst after awhile."  Naproxen 500mg  BID-trying to have with food per pt    [place]/ED precautions reviewed. Pt v/u.     Wynona Canes, RN BSN   Orthopedics RN Triage  (Do not reply to sender. Please send replies to RN Pools)

## 2022-11-21 NOTE — Telephone Encounter (Signed)
I received a below message from the triage nurse.  I contacted the patient.    The patient is experiencing hiccups, burping, and nausea following surgery.  It is not relieved with Zofran.  He actually states that after taking Zofran he seems to be more nauseous and vomits.      He is taking the oxycodone 1-3 tabs every 4-6 hours but this is not improving his pain.  He does not notice any pills when he vomits.  He feels that his symptoms of nausea and vomiting are result of the pain as he does not feel that it is well-controlled.      He states that he does have a history of a stomach ulcer in 2015 but this fully resolved and he has not had symptoms since.  He notes that the symptoms he is feeling now are actually different.  We did discuss discontinuing the Naprosyn as this may be irritating his stomach lining.  We discussed the addition of Tylenol.  He understands that this will not fully address his symptoms but he can take up to 4000 mg daily.      We discussed the use of Gatorade to help with electrolyte imbalance.      I did discuss with the patient that I would send an alternative anti nausea medication to the pharmacy.      Finally, I advised the patient I would update Dr. Merilynn Finland and determine the best course of action.  We could consider a long-acting narcotic or we may consider the possibility of sending him to the ED. He feels that since the nerve block wore off the pain meds make him feel lethargic and groggy but have not been addressing his pain.

## 2022-11-23 NOTE — Anesthesia Follow Up (Signed)
Regional Anesthesia Telephone Encounter Follow-Up Note  Peripheral Nerve Block    History of Present Illness:     Gavon Christon is a 32 year old male who is POD 4 status post left shoulder arthroscopy. The patient had a left interscalene peripheral nerve block placed for post-operative pain/intraoperative anesthesia.     The infusion contained ropivacaine 0.2% running at 8 mls/hour continuous, and 4 ml bolus with lockout of 30 minutes.    Regarding the pain, patient reports pain score: managed on PO pain meds.    Called Dr. Jacqualin Combes office 10/25 due to uncontrolled pain. Dr. Merilynn Finland prescribed oxycontin 15mg  BID. Patient notes pain is more manageable now.    Nerve block infusion completed and catheter was removed without complications. Nerve block resolved.     Patient reports:  Catheter removed with tip intact  Nerve block site is  clean and dry without erythema, induration or tenderness to palpation  Sensory block: absent  Motor block: absent    Assessment:  This is a 32 year old male with post-operative/acute pain who had poor pain control with nerve block catheter. Catheter removed, nerve block resolved. Pain controlled with PO pain meds.    Plan:   Block resolved, catheter removed. Pain controlled with PO meds. No further follow up needed.     Nickolas Madrid, MD

## 2022-11-23 NOTE — Addendum Note (Signed)
Addendum  created 11/23/22 1211 by Nickolas Madrid, MD    Clinical Note Signed, Intraprocedure Event edited

## 2022-12-01 ENCOUNTER — Encounter (INDEPENDENT_AMBULATORY_CARE_PROVIDER_SITE_OTHER): Payer: Self-pay | Admitting: Rehabilitative and Restorative Service Providers"

## 2022-12-01 ENCOUNTER — Ambulatory Visit (INDEPENDENT_AMBULATORY_CARE_PROVIDER_SITE_OTHER): Payer: Medicaid Other | Admitting: Rehabilitative and Restorative Service Providers"

## 2022-12-01 VITALS — BP 130/83 | HR 78 | Temp 97.4°F | Resp 16 | Ht 68.0 in | Wt 158.1 lb

## 2022-12-01 DIAGNOSIS — Z09 Encounter for follow-up examination after completed treatment for conditions other than malignant neoplasm: Secondary | ICD-10-CM

## 2022-12-01 DIAGNOSIS — M25512 Pain in left shoulder: Secondary | ICD-10-CM

## 2022-12-01 NOTE — Interdisciplinary (Addendum)
Procedure: Removed patient's post operative dressing prior to physical examination, per provider's order. Shoulder sling adjusted for fit and comfort.    Location: Left Upper Extremity    Ordering Provider: Fredia Sorrow, PA

## 2022-12-01 NOTE — Progress Notes (Signed)
[facility] SURG[facility], [facility]OR[facility] MEDICINE FOLLOWUP    PRIMARY CARE PROVIDER:   Dy, Lavonia Drafts      Procedure:   Left shoulder arthroscopy, anterior labral repair, capsulorrhaphy,  remplissage that is repair of infraspinatus tendon into Hill-Sachs lesion.     DOS:   November 19, 2022      HISTORY: Gordon Huang is a 32 year old male who presents today for evaluation of shoulder s/p above procedure, He starts PT 12/11/22.  He is no longer taking the MS Contin.  He continues to take the occasional oxycodone 5 mg.  He is wearing his sling.  His initial significant postoperative pain has seemingly resolved.  He remains on Tylenol and Naprosyn.  He denies fever, sweats and chills.  No other issues.    Past medical history, medications, allergies, review of systems are unchanged since the previous visit.      PHYSICAL EXAM:  VITALS: There were no vitals taken for this visit., There is no height or weight on file to calculate BMI.  GENERAL: A+Ox3. INAD. Well appearing and well groomed.  MENTAL STATUS: Pleasant and cooperative. Alert and oriented x3 with normal mood and affect.  Vascular: Pulses are palpable 2+, capillary refills to digits are normal.  Skin: No wounds/lesions/ulcers. Skin warm & dry.  Respiratory: No respiratory distress.    MUSCULOSKELETAL:    Shoulder: Left  Normal posture.  Shoulder: No swelling.  Incisions are healing well.  Range of Motion:  Good early range of motion with pendulums and elbow range of motion  Sensation intact to light touch in the bilateral upper extremities.  2+ radial pulses.    ==========    ASSESSMENT & PLAN: Gordon Huang is a 32 year old male 2 weeks status post left shoulder arthroscopy, anterior labral repair, capsulorrhaphy, remplissage.  He is doing well.  Therapeutic goals discussed.  He understands the importance of pendulums and elbow range of motion.  We discussed using the sling for 6 weeks.  He will follow up with Dr. Merilynn Finland at 6 weeks.  Arthroscopic  pictures reviewed.    Patient verbalizes understanding of all discussions today, and all questions were answered satisfactorily.

## 2022-12-12 ENCOUNTER — Ambulatory Visit: Payer: Medicaid Other | Attending: Pulmonary Disease

## 2022-12-12 DIAGNOSIS — U099 Post covid-19 condition, unspecified: Secondary | ICD-10-CM | POA: Insufficient documentation

## 2022-12-12 DIAGNOSIS — R0609 Other forms of dyspnea: Secondary | ICD-10-CM | POA: Insufficient documentation

## 2022-12-12 DIAGNOSIS — G9332 Myalgic encephalomyelitis/chronic fatigue syndrome: Secondary | ICD-10-CM | POA: Insufficient documentation

## 2022-12-12 DIAGNOSIS — Z7409 Other reduced mobility: Secondary | ICD-10-CM

## 2022-12-12 NOTE — Interdisciplinary (Signed)
Date: 12/12/2022    Time:1356-1500      Baxter Hire presents today for  Exercise Therapy and Patient Education      Pulmonary Rehabilitation/OP Respiratory Therapy Services  Individualized Treatment Plan   Patient Name: Gordon Huang  Gender: male  Age:32 year old  Language: English  Referring Physician: Narda Amber Chia-Chen  Primary Diagnosis:Diagnoses of Post-COVID chronic dyspnea, Post-COVID chronic fatigue, and Post-COVID chronic decreased mobility and endurance were pertinent to this visit.    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        Patient reported Medications:    Current Outpatient Medications:     (TRUVADA) emtricitabine 200 mg-tenofovir disoproxil 300 mg tablet, Take 1 tablet by mouth daily., Disp: , Rfl:     buPROPion (WELLBUTRIN XL) 300 MG XL tablet, Take 1 tablet (300 mg) by mouth daily., Disp: , Rfl:     docusate sodium (COLACE) 250 MG capsule, Take 1 capsule (250 mg) by mouth 2 times daily., Disp: 20 capsule, Rfl: 0    lamoTRIgine (LAMICTAL) 150 MG tablet, Take 1 tablet (150 mg) by mouth 2 times daily., Disp: , Rfl:     LORazepam (ATIVAN) 1 MG tablet, Take 1 tablet (1 mg) by mouth every 4 hours as needed for Anxiety., Disp: , Rfl:     methylphenidate (CONCERTA) 54 MG Controlled-Release tablet, Take 1 tablet (54 mg) by mouth daily., Disp: , Rfl:     naloxone (NARCAN) 4 mg/0.1 mL nasal spray, FOR SUSPECTED OVERDOSE, call 911! Tilt head, spray one spray into one nostril as needed for respiratory depression. If patient does not respond or responds and then relapses, repeat using a new nasal spray TO THE OTHER NOSTRIL every 3 minutes until emergency medical assistance arrives., Disp: 2 each, Rfl:  0    naltrexone (DEPADE) 50 MG tablet, Take 1 tablet (50 mg) by mouth. Compound into 50mL of water and 50mg  = 4ml, Disp: , Rfl:     naproxen (NAPROSYN) 500 MG tablet, Take 1 tablet (500 mg) by mouth 2 times daily (with meals). Okay to stop medication once pain and swelling well controlled., Disp: 40 tablet, Rfl: 0    oxyCODONE (ROXICODONE) 5 MG immediate release tablet, Take 1 tablet (5 mg) by mouth every 4 hours as needed for Severe Pain (Pain Score 7-10) (Okay to take 2 tabs po q4 hours for severe pain)., Disp: 30 tablet, Rfl: 0    PFTs:  PFT not done yet, will have an appointment on 12/17/22.    Orthopedic Assessment:   Orthopedic Assessment  Pain/ROM limitations: Yes    Fall Risk:   Fall Risk  1 or more of the following as reported by patient/caregiver = patient risk for falls: Fall within 6 months (patient fell down the stairs a couple months ago.)    Additional Care/concerns:  Allergies:  Allergies   Allergen Reactions    Beef Extract Other and Itching     All Red-Meat Products    Nsaids Itching     Additional Care Concerns  Vision: glasses    Surgical History:   No past surgical history on file.    :   6 MWT  Purpose for walk: Evaluation  Resting Vitals:  Blood  pressure (BP): 123/75  BP Location: Left arm  Heart Rate: 77  SpO2: 100 %  Measurement Type : Forehead  Oxygen: Room Air     Observation  Pursed Lips Breathing : No  Accessory Muscle Use : No  Shoulders Elevated : No  Balance: Fair  Gait: Steady  Assistive Devices: No  Summary  Total Distance (m): 293  MPH: 1.8  SPO2: 100  Heart Rate : 100  Patient Rated Breathlessness: 5  Patient Rated Muscle Fatigue: 4  Patient Rated pain: 4  Pain Location : left leg cramp  Sit to Stand 5 times: 12.58 seconds  Observations and Recommendations : left foot points slightly outwards    Dyspnea Assessment:   Dyspnea Assessment  Dyspnea Self Report: Dyspnea w/light ADL's (i.e. Dressing);Dyspnea w/moderate ADL's (i.e. bathing);Dyspnea w/heavy ADL's (i.e. yard  work);Requires assistance w/ADL's  Dyspnea Management Goals: Observable and reportable use of pursed lip breathing with exercise/ADL performance;Observable and reportable use of diaphragmatic breathing technique and benefits of strengthening the diaphragm.;Observable and reportable use of Box Breathing or 4-7-8 breathing method.  Dyspnea Plan/Intervention Education: Train breath control: pursed lip breathing and diaphragmatic breathing;Train pacing with exertion;Train positions for relief of dyspnea;Coach breath control during exercise therapy;Train in other breathing techniques;Provide education on understanding lung disease/condition with basic lung anatomy and function to facilitate better understanding and impact to quality of life.    SOBQ:   Score: 52  CAT:   CAT Total Score : 22    Cough Assessment:   Cough/Bronchial Hygiene Assessment  Couch/Bronchial Hygiene Assessment: Infrequent    Oxygen Assessment:   Oxygen Assessment  Oxygenation: No evidence of hypoxemia     SpO2: 100 %      Exercise Assessment:   Exercise Assessment  Patient Reported Current Exercise Level : Sedentary  Exercise Assessment Comments : patient stated prior to his shoulder surgery, he would walk every other day for 1 mile.    Exercise Prescription for Rehab:     Exercise Prescription Endurance Training  Endurance Training Type: Upper Body;Lower Body  Modes: Treadmill;Recumbent Elliptical;Cycle Ergometry Upper Body;arm-R-size  Goals for endurance:: Beginning goal of 5-10 minutes walking at 60-80% mph on Treadmill for lower body endurance. End goal of 100% of initial mph on treadmill;Beginning level 1-2 resistance on recumbent elliptical for lower body endurance. End goal at 30 minutes;Beginning goal of 5 minutes with upper body endurance. End goal of 15 minutes  Frequency per week: 1  Intensity: 3-4 Moderate to Somewhat Severe Breathlessness and Muscle Fatigue  Weights and/or resistance bands will be increased to a level of :  3-4 Moderate to Somewhat Severe   Beginning goal of 5-10 minutes walking at 60-80% mph on Treadmill for lower body endurance. End goal of 100% of initial mph on treadmill to PB 3-6 and PF 3-6.   Beginning level 1-2 resistance on recumbent elliptical for lower body endurance. End goal at 30 minutes to PB 3-6 and PF 3-6.  Beginning goal of 5 minutes with upper body endurance. End goal of 15 minutes to PB 3-6 and PF 3-6.   Exercise Prescription for Rehab Strength Training  Strength Training Type: Upper Body;Lower Body  Frequency per week: 1  Beginning weights in lbs.: 3 lbs.  Exercise prescription for Rehab Strength training comments: no exercising on left arm due to shoulder injury   Beginning goal of 1 set of 8 reps  End goal of 2-3 sets of 12-15 reps  Weights and/or resistance bands will be  increased PB 3-6 and PF 3-6     Oxygenation: No evidence of hypoxemia  Flexibility and stretching:Stretching after each endurance training    Exercise Prescription for home exercises:   Exercise Prescription Home Endurance Training  Home Endurance Training Type: Upper Body;Lower Body  Mode: walking  Frequency per week: 4 to 6  Beginning goal of 5-10 minutes to an end goal of 20-30 minutes for lower body endurance to report PB/PF of: 3-4 Moderate to Somewhat Severe  Beginning goal of 5 minutes to an end goal of 10-15 minutes for upper body endurance to report PB/PF of: 3-4 Moderate to Somewhat Severe  Beginning goal of 5-10 minutes to an end goal of 20-30 minutes to report PB/PF 3-6 for lower body endurance.   Beginning goal of 5 minutes to an end goal of 10-15 minutes to report PB/PF 3-6 for upper body endurance.    Exercise Prescription Home Strength Training  Home Strength Training Type: Upper Body;Lower Body  Frequency per week: 1 to 3  Weights and/or resistance bands and increase as per symptoms reported by patient  Beginning goal of 1 set of 8 reps  End goal of 2-3 sets of 12-15 reps  Flexibility and stretching:  Stretching after each endurance training  No exercising on left arm due to shoulder injury. He underwent surgery on November 19, 2022 and anticipates a recovery to no longer than a year.     Psychosocial Assessment:   Psychosocial Assessment  Emotional Health Self-Report: Depression  Stress Level (Self Report): High  Social Support (self-report): Adequate  Psychosocial Goals: Identify coping strategies and stress management techniques;Improve management of depression, anxiety, and/or stress related quality of life  Psychosocial Plan/Intervention Education: Instruct breath control and relaxation  Psychosocial Assessment Comments : Informed patient of the policy to notify PCP about elevated PHQ9 of 10 and above. Patient prefers for PCP not to be notified due to on going treatment with his psychiatrist.    PHQ9   Thoughts that you would be better off dead, or of hurting yourself in some way: Not at all  PHQ9 Patient Summary Score (calculated): 11    Other Core Components  Smoking Assessment:   Smoking Assessment  Smoking Status: Quit Smoking    Smoking History:   Social History     Tobacco Use   Smoking Status Former    Average packs/day: 0.5 packs/day for 11.0 years (5.5 ttl pk-yrs)    Types: Cigarettes    Start date: 2006    Quit date: 2017    Years since quitting: 7.8    Passive exposure: Past   Smokeless Tobacco Former   Tobacco Comments    Mother smoked     Nutrition Assessment:   Nutrition Assessment  Weight: 70.3 kg (155 lb)  Height: 5\' 8"  (172.7 cm)  BMI kg/m2: 23.57 kg/m2  Patient has Diabetes : No  Recent Weight Fluctuation: None  Appetite: Good  Difficulty with food preparation?: Yes  Nutrition Plan/Intervention Education: Coaching and support with weight and nutrition goals as needed  Rate Your Plate Score: no  Sleep Quality Assessment:   Sleep Quality Assessment  Sleep Quality: Poor  OSA (obstructive sleep apnea) : No  Sleep Comments : patient isn't diagnosed with sleep apnea as of now, but he has a home  sleep study test on monday.    Inhaled Medication Use:   Inhaled Medication Use   Inhaled Medications: N/A    Self care management Assessment:   Self Care Management Assessment  Respiratory Infection Exacerbation Risk: Knowledge deficit: Respiratory Infection Prevention/Management  Respiratory Infection Exacerbation Risk Goal: Describes signs/symptoms, methods to identify & prevent exacerbations.;Can articulate use of action/emergency plan  Respiratory Infection Exacerbation Risk Plan/Intervention Education: Hand hygiene;Evaluate sputum;When to call MD;Gets vaccines;Action/emergency plan use    Exacerbation History:   Exacerbation History  Frequent respiratory infections: No    Summary and Recommendations:    Kimberley Cohee is a 32 year old male with post-COVID chronic dyspnea, post-COVID chronic fatigue, post-COVID chronic decrease mobility and endurance, referred to [facility] by Dr. Regino Schultze of [place] for monitored exercise and education.  Patient's chief complaint is sob.  Mr. Krenz states he had Covid-19 on three occasions reporting his most recent Covid episode was the worse. Patient states he had significant breathlessness, experienced difficulty getting out of bed, had an intolerance for walking and had brain fog. He states his symptom burden has been improving, but continues to experience shortness of breath and has noticed difficulty in focusing and suspects his difficulty focusing may be due to ADHD. Patient is currently being followed by a Neurologist.       Patient placed on continuous oximetry for assessment of oxygenation to keep saturations greater than/equal to 90% on room air     Discussed recommendation of enrollment, with patient, to the pulmonary rehabilitation program with sessions once a week for 16 sessions.      Education/emphasis will be on:  Providing education for patient's disease process.  Training on energy conservation, purse lip breathing, diaphragmatic breathing, pacing,  and the importance of posture.  Optimizing individual physical and social functional capacity.  Increasing independence with performing activities of daily living  Increasing strength and endurance  Increasing tolerance with dyspnea and fatigue  Increasing walking and activity tolerance    Patient agrees with plan of care.      Cassandra Pugeda-Lascano, RCP

## 2022-12-15 ENCOUNTER — Ambulatory Visit (INDEPENDENT_AMBULATORY_CARE_PROVIDER_SITE_OTHER): Payer: Medicaid Other | Admitting: Pulmonary Disease

## 2022-12-15 DIAGNOSIS — U099 Post covid-19 condition, unspecified: Secondary | ICD-10-CM

## 2022-12-15 DIAGNOSIS — R41840 Attention and concentration deficit: Secondary | ICD-10-CM

## 2022-12-15 DIAGNOSIS — Z7409 Other reduced mobility: Secondary | ICD-10-CM

## 2022-12-15 DIAGNOSIS — G4733 Obstructive sleep apnea (adult) (pediatric): Secondary | ICD-10-CM

## 2022-12-15 NOTE — Procedures (Unsigned)
[facility]  [address] [place] [address]  Phone [phone]      MEDICAL EQUIPMENT DISCLOSURE      Tremell Kissell Date of Birth: 07/06/1990  MRN [id number]      The equipment patient is taking is a [facility] Sleep Study 12/15/22:  He/She was referred for a home sleep test (HST) for symptoms and concerns associated with OSA. Education of the [employer] equipment was completed by Reginia Naas, RPSGT.    Visit Summary: Instruction was given on the use and proper placement of each component of the HST.  After reviewing the procedure with the sleep tech, the patient verbally demonstrated how to properly place the HST device and all necessary components (respiratory belt, ECG, airflow/nasal pressure, and SpO2).  The patient was given an on call number of [phone] to call with any questions they may have.     This equipment is for diagnostic purposes only.    Further, your signature below indicates that patient has been instructed on operating the device.    Instructor's Name: Reginia Naas, RPSGT      Date: 12/15/22    Electronically signed by, Reginia Naas , RPSGT

## 2022-12-17 ENCOUNTER — Ambulatory Visit
Admission: RE | Admit: 2022-12-17 | Discharge: 2022-12-17 | Disposition: A | Discharge status: Home / Self Care | Payer: Medicaid Other | Attending: Pulmonary Disease | Admitting: Pulmonary Disease

## 2022-12-17 ENCOUNTER — Ambulatory Visit (HOSPITAL_COMMUNITY): Payer: Medicaid Other

## 2022-12-17 DIAGNOSIS — R0609 Other forms of dyspnea: Secondary | ICD-10-CM | POA: Insufficient documentation

## 2022-12-17 DIAGNOSIS — U099 Post covid-19 condition, unspecified: Secondary | ICD-10-CM | POA: Insufficient documentation

## 2022-12-17 NOTE — Procedures (Signed)
No note

## 2022-12-18 ENCOUNTER — Encounter (HOSPITAL_BASED_OUTPATIENT_CLINIC_OR_DEPARTMENT_OTHER): Payer: Self-pay | Admitting: Pulmonary Disease

## 2022-12-23 ENCOUNTER — Ambulatory Visit (HOSPITAL_BASED_OUTPATIENT_CLINIC_OR_DEPARTMENT_OTHER): Payer: Medicaid Other

## 2022-12-23 VITALS — BP 120/77 | HR 82

## 2022-12-23 DIAGNOSIS — G9332 Myalgic encephalomyelitis/chronic fatigue syndrome: Secondary | ICD-10-CM

## 2022-12-23 DIAGNOSIS — R0609 Other forms of dyspnea: Secondary | ICD-10-CM

## 2022-12-24 NOTE — Interdisciplinary (Signed)
[facility]/OP Respiratory Therapy Services  Date: Tuesday December 23, 2022    Gordon Huang presents today for Exercise Therapy and Patient Education    Time: 1610-9604          Patient Name: Gordon Huang  Gender: male  Age: 32 year old  Referring Physician: Skipper Cliche  Referring Diagnosis: There were no encounter diagnoses.  No data recorded  Patient's Chief No data recorded   INITIAL  S: " I am just tired all the time."   O: Vitals within normal limits for patient.      Patient Education:   Patient Education  Education Provided: BREATHING RETRAINING: Provided patient with skills training of pursed lip and diaphragmatic breathing to improve shortness of breath with exertion and when performing daily activities.    Exercise Today:  Resting Vitals  Resting Vital Signs  Blood pressure (BP): 120/77  BP Location: Left arm  Heart Rate: 82  SpO2: 99 %  Measurement Type : Forehead  Oxygen: Room Air         Lower body Endurance Training   Treadmill: No, due to hx of dizziness and fatigue at baseline.   Recumbent Elliptical: Yes  Resistance Level : 1  Steps/Min/Watts/METS: 51  Duration in Minutes : 10  Breathlessness: 5  Muscle Fatigue  : 3  Pain scale : 6  Pain Location : Chronic left shoulder pain  Heart Rate : 93  SpO2: 100  O2 Prescribed: No  Measurement Type : Finger  Change: Initiated    Runner, broadcasting/film/video   Strength Therapy : Yes - Upper Body;Yes - Lower Body  UB Free Weight #'s : 2 (Right arm only)  UB Reps: 8  UB Sets: 1  Change: Initiated  LB Free Weight #'s : 2  LB Reps: 8  LB Sets: 1  LB Seated side Steps : Yes  LB Seated side Steps Reps: 8  LB Seated side Steps Sets: 1  Change: Initiated         Supervised Exercise Session Time: 20 Minutes    Summary:  Patient's session consisted of education, strength, and endurance training.  Please refer above synopsis of today's exercise session.    Oxygen was continuously monitored throughout exercise session to maintain  saturations greater than/equal to 90% on room air.    Plan:  Increase upper/lower body strength training with 2 upper & 2 lower lbs. at 1 set(s) of 10 reps.   Perform upper body endurance with Cycle Ergometry Upper body at 1 resistance for 5 minutes or to symptoms report 3-4 Moderate to Somewhat Severe PB/PF.  Increase lower body endurance with Recumbent Cross trainer Elliptical  at 1 resistance for 15 minutes or to symptoms report 3-4 Moderate to Somewhat Severe PB/PF.  Make therapeutic changes to patient's exercise prescription per symptoms at the time of session.  Education for next session will consist of Home Exercises.       Carlisle Cater Sison-Tojino, RCP

## 2022-12-26 LAB — SPIROMETRY
FEF 25/75 % PREDICTED: 89 L/s
FEF 25/75: 3.76 L/s
FEV1 % PREDICTED: 96 L
FEV1/FVC % PREDICTED: 96 %
FEV1/FVC: 0.8 %
FEV1: 3.93 L
FVC % PREDICTED: 99 L
FVC: 4.93 L

## 2022-12-30 DIAGNOSIS — G4733 Obstructive sleep apnea (adult) (pediatric): Secondary | ICD-10-CM

## 2022-12-30 NOTE — Procedures (Unsigned)
Scoring Tech Checklist for Sleep Study Process  Steps to Complete:  Verify Orders:  [x]  Ensure that the sleep study order in G3 matches the order in EPIC.  Generate Correct Report:  [x]  Confirm that the correct report type is generated:  [x]  HST (Home Sleep Test)  []  PSG (Polysomnography)  []  SPLIT  []  TITRATION  Document on First Epoch:  [x]  Document your name on the first [facility] of the sleep study.  Enter Data in Custom Properties:  [x]  Enter all required data in the custom properties section in G3.  Document Appropriate Notes:  [x]  Include appropriate notes in the report for the following, if applicable:  []  CPAP (Continuous Positive Airway Pressure)  []  OAT (Oral Appliance Therapy)  []  SPLIT (Split-night Study)  []  O2 (Oxygen Therapy)  []  ASV (Adaptive Servo-Ventilation)  []  INSPIRE (Hypoglossal Nerve Stimulation)  Confirmed Diagnosis Code:  [x]  Confirm that the diagnosis code has been entered correctly.  Recording Tech Notes:  [x]  Confirm that recording tech notes have been entered.  Assign Study to Provider:  [x]  Confirm that the sleep study has been assigned to the correct reading provider.

## 2023-01-01 ENCOUNTER — Ambulatory Visit: Payer: Medicaid Other | Attending: Pulmonary Disease

## 2023-01-01 VITALS — BP 124/70 | HR 80

## 2023-01-01 DIAGNOSIS — R0609 Other forms of dyspnea: Secondary | ICD-10-CM | POA: Insufficient documentation

## 2023-01-01 DIAGNOSIS — U099 Post covid-19 condition, unspecified: Secondary | ICD-10-CM | POA: Insufficient documentation

## 2023-01-01 NOTE — Interdisciplinary (Signed)
[facility]/OP Respiratory Therapy Services  Date: Thursday January 01, 2023    Howe Charlesworth presents today for Patient Education and Exercise Therapy    Time: 1610-9604          Patient Name: Gordon Huang  Gender: male  Age: 32 year old  Referring Physician: Skipper Cliche  Referring Diagnosis: The encounter diagnosis was Post-COVID chronic dyspnea.  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    Patient Education:   Patient Education  Education Provided: BREATHING RETRAINING: Provided patient with skills training of pursed lip and diaphragmatic breathing to improve shortness of breath with exertion and when performing daily activities.    Exercise Today:  Resting Vitals  Resting Vital Signs  Blood pressure (BP): 124/70  BP Location: Left arm  Heart Rate: 80  SpO2: 100 %  Measurement Type : Finger  Oxygen: Room Air         Lower body Endurance Training   Recumbent Elliptical: Yes  Resistance Level : 1  Duration in Minutes : 10  Breathlessness: 5 (4-5)  Muscle Fatigue  : 4  Pain scale : 0  Heart Rate : 93  SpO2: 97  O2 Prescribed: No  Measurement Type : Finger  Change: Maintained  Comment: : Patient is experiencing increased fatigue today.    Strength Training   Strength Therapy : Yes - Upper Body;Yes - Lower Body  UB Free Weight #'s : 2  UB Reps: 10  UB Sets: 1  Change: Increased from  Increased from comment:: 8 reps  LB Free Weight #'s : 2  LB Reps: 10  LB Sets: 1  LB Seated side Steps : Yes  LB Seated side Steps Reps: 10  LB Seated side Steps Sets: 1  Change: Increased from  Increased from comment:: 8 reps         Supervised Exercise Session Time: 35 Minutes    Summary:  Patient's session consisted of education, strength, and endurance training.  Please refer above synopsis of today's exercise  session.    Oxygen was continuously monitored throughout exercise session to maintain saturations greater than/equal to 90% on room air.    Plan:  Increase upper/lower body strength training with 2 upper & 2 lower lbs. at 1 set(s) of 11-12 reps.   Increase lower body endurance with Recumbent Cross trainer Elliptical  at 1 resistance for 12 minutes or to symptoms report 2-3 Slight to Moderate PB/PF.  Make therapeutic changes to patient's exercise prescription per symptoms at the time of session.  Education for next session will consist of Home Exercises.     Margarita Sermons, RCP

## 2023-01-05 ENCOUNTER — Ambulatory Visit (HOSPITAL_BASED_OUTPATIENT_CLINIC_OR_DEPARTMENT_OTHER): Payer: Medicaid Other | Admitting: Pulmonary Disease

## 2023-01-05 ENCOUNTER — Ambulatory Visit: Payer: Medicaid Other | Admitting: Internal Medicine

## 2023-01-05 ENCOUNTER — Ambulatory Visit (HOSPITAL_BASED_OUTPATIENT_CLINIC_OR_DEPARTMENT_OTHER): Payer: Medicaid Other | Admitting: Internal Medicine

## 2023-01-06 ENCOUNTER — Encounter (HOSPITAL_BASED_OUTPATIENT_CLINIC_OR_DEPARTMENT_OTHER): Payer: Self-pay | Admitting: Pulmonary Disease

## 2023-01-06 ENCOUNTER — Encounter (INDEPENDENT_AMBULATORY_CARE_PROVIDER_SITE_OTHER): Payer: Self-pay | Admitting: Sports Medicine

## 2023-01-06 ENCOUNTER — Ambulatory Visit (INDEPENDENT_AMBULATORY_CARE_PROVIDER_SITE_OTHER): Payer: Medicaid Other | Admitting: Sports Medicine

## 2023-01-06 DIAGNOSIS — G8918 Other acute postprocedural pain: Secondary | ICD-10-CM

## 2023-01-06 DIAGNOSIS — M25312 Other instability, left shoulder: Secondary | ICD-10-CM

## 2023-01-06 DIAGNOSIS — Z09 Encounter for follow-up examination after completed treatment for conditions other than malignant neoplasm: Secondary | ICD-10-CM

## 2023-01-06 DIAGNOSIS — U099 Post covid-19 condition, unspecified: Secondary | ICD-10-CM

## 2023-01-06 MED ORDER — OXYCODONE-ACETAMINOPHEN 5-325 MG OR TABS
1.0000 | ORAL_TABLET | Freq: Four times a day (QID) | ORAL | 0 refills | Status: AC | PRN
Start: 2023-01-06 — End: 2023-04-06

## 2023-01-06 MED ORDER — NAPROXEN 500 MG OR TABS
500.0000 mg | ORAL_TABLET | Freq: Two times a day (BID) | ORAL | 1 refills | Status: AC | PRN
Start: 2023-01-06 — End: ?

## 2023-01-06 NOTE — Progress Notes (Signed)
[facility] DEPARTMENT OF ORTHOPAEDIC SURGERY, SPORTS MEDICINE    PRIMARY CARE PROVIDER:   Dy, Lavonia Drafts    Procedure:   Left shoulder arthroscopy, anterior labral repair, capsulorrhaphy,  remplissage that is repair of infraspinatus tendon into Hill-Sachs lesion.     DOS:   November 19, 2022    HISTORY: Gordon Huang is a 32 year old male who presents today for evaluation now 6 weeks s/p the above procedure. He is doing well all overall. He is working on passive range of motion of his shoulder will physical therapy but has not yet switched to active ROM. He reports continued pain requiring narcotic medication every few days. He denies any fevers/chills, denies any problems with the incision.    Past medical history, medications, allergies are unchanged.    PHYSICAL EXAM:  VITALS: There were no vitals taken for this visit.  GENERAL: A+Ox3. INAD. Well appearing and well groomed. Normal gait  MENTAL STATUS: Pleasant and cooperative. Alert and oriented x3 with normal mood and affect.  Vascular: Pulses are palpable 2+, capillary refills to digits are normal.  Skin: No wounds/lesions/ulcers. Skin warm & dry.  Respiratory: No respiratory distress    MUSCULOSKELETAL:    Shoulder: Left  Normal posture.  Shoulder: No swelling.  Incisions are healed  Range of Motion:  Some passive ROM with small pendulums, full flexion and extension of elbow  Strength is not tested.  Sensation intact to light touch in the bilateral upper extremities.  2+ radial pulses.    ==========    ASSESSMENT & PLAN: Gordon Huang is a 32 year old male 6 weeks status post left shoulder arthroscopy, anterior labral repair, capsulorrhaphy, remplissage.  He is doing well.  Therapeutic goals discussed.  He may now begin active assisted range of motion at this time with physical therapy.  I think he has a little bit behind with regard to range of motion and encouraged him to keep the arm moving.  He requests additional medication for pain.  I will  write him for a couple of additional Percocet but he is aware that further narcotic prescriptions will not be given.  I also gave him a prescription for Naprosyn.  He is agreeable with this plan.  He will follow up in our clinic in two months.     Patient verbalizes understanding of all discussions today, and all questions were answered satisfactorily.    I saw the patient, performed a physical exam, and developed the treatment plan.    I edited the note and agree with the content as written.    See PA/resident note for full detail.

## 2023-01-08 ENCOUNTER — Ambulatory Visit: Payer: Medicaid Other | Attending: Pulmonary Disease

## 2023-01-08 VITALS — BP 123/82 | HR 92 | Wt 155.0 lb

## 2023-01-08 DIAGNOSIS — U099 Post covid-19 condition, unspecified: Secondary | ICD-10-CM | POA: Insufficient documentation

## 2023-01-08 DIAGNOSIS — R0609 Other forms of dyspnea: Secondary | ICD-10-CM | POA: Insufficient documentation

## 2023-01-08 NOTE — Interdisciplinary (Cosign Needed)
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   O: Patient's vitals are within normal limits.       Initial and Current outcomes:  SOBQ: Initial 52; Score: 61  CAT: Initial 22; CAT Total Score : 19        Dyspnea Reassessment:   Dyspnea Reassessment  Dyspnea Initial Assessment Date: 12/12/22  Uses Breathing Techniques: Box Breathing  BB Scale: Improved, requires cue <50%  Dyspnea Reassessment Comments : Patient reports that he uses Box breathing once/twice a day. He reports that it actually has aided him in falling asleep.    Oxygen Reassessment:  Oxygen Reassessment  Oxygen Reassessment Evaluation : No prescribed supplemental oxygen    Exercise Rehab Reassessment:  Exercise Reassessment in Rehab  Frequency: 1  Endurance Training: Lower body  Endurance Mode: Recumbent Crosstrainer Elliptical  Recumbent Initial resistance and duration: #1 for 10 minutes  Recumbent current resistance and duration: #1 for 15 minutes  Strength Training: Upper body;Lower body  Initial Upper body sets, reps & load: 1 set of 8 reps 2lbs  Current upper body sets, reps & load: 1 set of 10 reps 2lbs  Initial lower body sets, reps & load: 1 set of 8 reps 2lbs  Current lower body sets, reps & load: 1 set of 10 reps 2lbs  O2 Sat Level: Monitor & Maintain O2 Sat at 90% or >  Prescribed supplemental oxygen with exertion: No  SpO2: 100 %  Measurement Type : Forehead  Oxygen: Room Air             Exercise  Home Reassessment:  Exercise Reassessment in Home  Comments: Patient reports the only time he is active is during physical therapy. He has not been able to do any exercises outside of PT. Since coming to Pulmonary Rehab he has not used his left arm for any type of exercise.     Psychosocial Reassessment:  Psychosocial Reassessment  Identifies coping strategies and stress management techniques: Progressing  Improved management of depression, anxiety, and/or stress related quality of life : Progressing  PHQ9 Patient Initial Summary Score : 11  Psychosocial Reassessment Comments : Patient reports that he understand he has depression and works closely with his pyscharitrist as well as seeing a therapist twice a week. Compared to his initial PHQ9 he had a 1 on question 9. He reports no plans or to act on this thought.  Thoughts that you would be better off dead, or of hurting yourself in some way: (!) Several days  PHQ9 Patient Summary Score (calculated): 13   Patient refused notifying his PCP about his recent change on his PHQ9 score.      OTHER CORE COMPONENTS:  Nutrition Reassessment:  Verbalizes understanding of impact of weight and nutrition on overall disease prognosis, physical activity, and mobility: Progressing  Has identified goals for attaining healthy body weight: Progressing  Has Identified methods to improve ease of food preparation/shopping: Not Progressing  Met with Dietician/Nutritionist: No  Nutrition Reassessment Comments: Patient has not gone over nutrition education.  Initial Height: 5\' 8"  (1.727 m)   & WT 155 lbs  Current Height: 5\' 8"  (1.727 m)   & Weight: 70.3 kg (155 lb)      Cough Reassessment:  Cough/Bronchial Hygiene Reassessment  Patient demonstrates effective cough: Yes  Secretion management: No Change  Cough Reassessment Comments : Patient reports that he coughs every morning and only has clear to white sputum. Reports no issues with coughing.    Smoking Reassessment:  Smoking  Reassessment  Smoking Status: Remains Nonsmoker    Sleep Quality Reassessment:  Sleep Quality Reassessment  Sleep Quality: Fair  OSA (obstructive sleep apnea) : No  Sleep Quality Reassessment Comment: Patient reports his sleep is average.    Inhaled Medications Use Reassessment:  Inhaled Medication Use Reassessment  Inhaled Medication Reassessment Comments: No inhalers    Self-Care Management Reassessment:  Self Care Management Reassessment  Has understanding of respiratory infection prevention management: Progressing  Last date of unplanned hospital readmission/ER visit due to a pulmonary exacerbation: N/a  Recent exacerbation: No       Exercise Today  Resting Vitals:  Resting Vital Signs  Blood pressure (BP): 123/82  BP Location: Right arm  Heart Rate: 92  SpO2: 100 %  Measurement Type : Forehead  Oxygen: Room Air    Lower body Endurance Training   Recumbent Elliptical: Yes  Resistance Level : 1  Steps/Min/Watts/METS: 55  Duration in Minutes : 15  Breathlessness: 5  Muscle Fatigue  : 4  Pain scale : 0  Heart Rate : 87  SpO2: 98  O2 Prescribed: No  Measurement Type : Finger  Change: Increased from  Increased from comment:: 10 minutes                   6 MINUTE WALK TEST  6 MWT  Purpose for walk: Reassessment  Pre 6 MWT:  Resting Vitals:  Blood pressure (BP): 123/82  BP Location: Right arm  Heart Rate: 92  SpO2: 100 %  Measurement Type : Forehead  Oxygen: Room Air         Observation  Pursed Lips Breathing : No  Accessory Muscle Use : No  Shoulders Elevated : No  Balance: Fair  Gait: Steady  Assistive Devices: No    Post 6 MWT  Summary  Total Distance (m): 288  MPH: 1.78  SPO2: 100  Heart Rate : 101  Patient Rated Breathlessness: 8  Patient Rated Muscle Fatigue: 4  Patient Rated pain: 0  Sit to Stand 5 times: 10.84  Observations and Recommendations : Patient reported after he felt a little dizzy    Patient Education:   Patient Education  Education Provided: No education provided this session.    Supervised  Exercise Session Time: 30 Minutes    REASSESSMENT SUMMARY:  Elimelech Merkin is a 32 year old year old male, referred to [facility] by Skipper Cliche of [place], for monitored exercise and education.   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      Banjamin Whitten Ambrosino's session consisted of strength and endurance training with outcome measures obtained. Please refer to Outcomes and Exercise sections for summary of today's session.   Patient has attended 3 sessions thus far in Pulmonary rehab.  Patient does not report a higher tolerance for performing ADL's since starting PR.  Patient has progressed with his rehab exercise modalities since start of  care. Please refer to Exercise Reassessment section for a summary.     Oxygen was continuously monitored throughout exercise session to maintain saturations greater than/equal to 90% on room air.    PLAN:  Maintain upper/lower body strength training with 2 upper & 2 lower lbs. at 1 set(s) of 12 reps.   Maintain lower body endurance with Recumbent Cross trainer Elliptical  at 1 resistance for 20 minutes or to symptoms report 3-4 Moderate to Somewhat Severe PB/PF.  Make therapeutic changes to patient's exercise prescription per symptoms at the time of session.  Education for next session will consist of Home Exercises.      Carollee Massed Dan Maker, RCP

## 2023-01-12 ENCOUNTER — Ambulatory Visit: Payer: Medicaid Other | Attending: Pulmonary Disease | Admitting: Internal Medicine

## 2023-01-12 ENCOUNTER — Ambulatory Visit (HOSPITAL_BASED_OUTPATIENT_CLINIC_OR_DEPARTMENT_OTHER): Payer: Medicaid Other | Admitting: Pulmonary Disease

## 2023-01-12 ENCOUNTER — Ambulatory Visit (HOSPITAL_BASED_OUTPATIENT_CLINIC_OR_DEPARTMENT_OTHER): Payer: Medicaid Other | Admitting: Internal Medicine

## 2023-01-12 ENCOUNTER — Encounter (HOSPITAL_BASED_OUTPATIENT_CLINIC_OR_DEPARTMENT_OTHER): Payer: Self-pay | Admitting: Internal Medicine

## 2023-01-12 ENCOUNTER — Other Ambulatory Visit (HOSPITAL_BASED_OUTPATIENT_CLINIC_OR_DEPARTMENT_OTHER): Payer: Medicaid Other

## 2023-01-12 ENCOUNTER — Encounter (HOSPITAL_BASED_OUTPATIENT_CLINIC_OR_DEPARTMENT_OTHER): Payer: Self-pay | Admitting: Pulmonary Disease

## 2023-01-12 ENCOUNTER — Ambulatory Visit (HOSPITAL_BASED_OUTPATIENT_CLINIC_OR_DEPARTMENT_OTHER): Payer: Medicaid Other | Admitting: Family Practice

## 2023-01-12 ENCOUNTER — Encounter (INDEPENDENT_AMBULATORY_CARE_PROVIDER_SITE_OTHER): Payer: Self-pay | Admitting: Hospital

## 2023-01-12 VITALS — BP 119/72 | HR 100 | Temp 98.2°F | Resp 18 | Ht 68.0 in | Wt 160.0 lb

## 2023-01-12 DIAGNOSIS — R2990 Unspecified symptoms and signs involving the nervous system: Secondary | ICD-10-CM

## 2023-01-12 DIAGNOSIS — R0609 Other forms of dyspnea: Secondary | ICD-10-CM

## 2023-01-12 DIAGNOSIS — G4710 Hypersomnia, unspecified: Secondary | ICD-10-CM | POA: Insufficient documentation

## 2023-01-12 DIAGNOSIS — U099 Unspecified symptoms and signs involving the nervous system: Secondary | ICD-10-CM

## 2023-01-12 DIAGNOSIS — R41840 Attention and concentration deficit: Secondary | ICD-10-CM

## 2023-01-12 DIAGNOSIS — Z789 Other specified health status: Secondary | ICD-10-CM

## 2023-01-12 DIAGNOSIS — G9332 Myalgic encephalomyelitis/chronic fatigue syndrome: Secondary | ICD-10-CM

## 2023-01-12 DIAGNOSIS — G4733 Obstructive sleep apnea (adult) (pediatric): Secondary | ICD-10-CM

## 2023-01-12 LAB — CBC WITH DIFF, BLOOD
ANC-Automated: 2.1 10*3/uL (ref 1.6–7.0)
Abs Basophils: 0.1 10*3/uL (ref <0.2)
Abs Eosinophils: 0.1 10*3/uL (ref 0.0–0.5)
Abs Lymphs: 2.3 10*3/uL (ref 0.8–3.1)
Abs Monos: 0.2 10*3/uL (ref 0.2–0.8)
Basophils: 1.1 %
Eosinophils: 1.3 %
Hct: 45.3 % (ref 40.0–50.0)
Hgb: 16.1 g/dL (ref 13.7–17.5)
Imm Gran %: 0.4 % (ref <1)
Imm Gran Abs: 0 10*3/uL (ref <0.1)
Lymphocytes: 48.8 %
MCH: 31.5 pg (ref 26.0–32.0)
MCHC: 35.5 g/dL (ref 32.0–36.0)
MCV: 88.6 um3 (ref 79.0–95.0)
MPV: 8.8 fL — ABNORMAL LOW (ref 9.4–12.4)
Monocytes: 4.3 %
Plt Count: 313 10*3/uL (ref 140–370)
RBC: 5.11 10*6/uL (ref 4.60–6.10)
RDW: 12 % (ref 12.0–14.0)
Segs: 44.1 %
WBC: 4.7 10*3/uL (ref 4.0–10.0)

## 2023-01-12 LAB — FREE THYROXINE, BLOOD: Free T4: 1.49 ng/dL (ref 0.93–1.70)

## 2023-01-12 LAB — TSH, BLOOD: TSH: 3.18 u[IU]/mL (ref 0.27–4.20)

## 2023-01-12 LAB — CORTISOL, BLOOD: Cortisol: 5.3 ug/dL

## 2023-01-12 LAB — VITAMIN B12, BLOOD: Vitamin B12: 655 pg/mL (ref 232–1245)

## 2023-01-12 MED ORDER — BUDESONIDE-FORMOTEROL FUMARATE 160-4.5 MCG/ACT IN AERO
INHALATION_SPRAY | RESPIRATORY_TRACT | 4 refills | Status: AC
Start: 2023-01-12 — End: ?

## 2023-01-12 MED ORDER — AEROCHAMBER Z-STAT PLUS MISC
1 refills | Status: AC
Start: 2023-01-12 — End: ?

## 2023-01-12 NOTE — Patient Instructions (Addendum)
 Gordon Huang,    It was a pleasure working with you    The supplements that I mentioned in our visit are listed below    Consider trying each of them all at once, or individually if you prefer.  If you try one at a time, begin with CoQ-10 (Coenzyme Q-10 - ubiquinol) for a week,     Followed by Cordyceps in addition to the above for the 2nd week,     then in the 3rd week, continue both the above, and add the  [employer] Brain.    Take a dose of 200mg  or 300mg  or 400mg  every AM of CoQ10.    Brands of CoQ-10 (Coenzyme Q-10/ ubiquinol) I recommend are:    Donella Fulling  NOW   [employer]  Protocol for Life Balance    [employer] sells these online and in several natural stores around town:    Cordyceps "Energy" - take 2 capsules before noon    "MycoBrain" - two capsules a day, either together or divided.    www.hostdefense.com    Patient Instructions   Tips on long covid and fatigue from Dr. Clemente Huang    Maximize quality of sleep and  Quantity of sleep    Go to sleep earlier.    Pacing activity is especially important.    Pacing - American ME and CFS Society (ammes.org)     "Staying within the "energy envelope," or pacing, is one way a person with ME/CFS can help mitigate PEM. Avoiding activities which will place too great a strain on the body's ability to produce adequate ATP is a key strategy of pacing, but adequate rest is equally as important. What is important in pacing is to avoid crossing the energy threshold.  Knowing when you have crossed the energy threshold is tricky. In part, it depends on how ill a person is. A severely ill patient crosses the energy threshold daily simply by being ill. However, mildly and moderately ill patients may not even be aware when they have "done too much." For these patients, it is important to adjust activities to avoid the worst effects of PEM.  The website link above provides some important strategies to help patients pace.   = = = = =   Rest/pace:  A low  mitochondria reserve has demonstrated in ME/CFS Gordon Huang et al, PLOS one 2017)  - Pre-emptive rest before engaging in physical or stimulatory activity can be helpful.  - Avoid energy crashes by limiting the actions that will exacerbate the symptoms of ME/CFS. Remember those events/crashes can occur days after overdoing physical or stimulation activities.  - Tracking your activities (daily steps, time spent, heart rate, use of electronics, etc.) by using monitoring tools such as your smartphone, smartwatch, etc. can be Very helpful to learn your limits    Avoid all alcohol    Include 5 different vegetables or fruits every day  Variety - 20 per week  Spices count!    CoQ10 could be a helpful supplement for you  200 or 300mg  daily while you are taking the statin  Select a good brand   Shop carefully online for supplements as we discussed    Check out the book "the Long Covid Solution" by Gordon Bernard MD    Thank you.    Remember, I will be very delayed in replying to messages.    - Gordon Cyphers MD      You have been referred to the [facility]  for Integrative Medicine at [place]   [facility]cup[facility] and   Department of Osteopathic Manual Medicine for a clinic consult. Please call to schedule an appointment at your earliest convenience.  Insurance varies from person to person and you may also need an insurance authorization prior to being seen.     Acupuncture & Osteopathic Manual Medicine Department scheduling phone number:  [phone]     Osteopathic Manual Medicine:  Doctors of Osteopathy (DO) go through educational training similar to allopathic (MD) doctors, and are similarly licensed to practice a full scope of medicine in any specialty of choice. DOs are also trained in a variety of different manual medicine procedures - using their hands and touch to diagnose and treat you. They may work on your bones, joints, muscles, fascia, nerves, lymphatics, circulation and more over the course of a treatment. Osteopathic manual  medicine can improve your health and alleviate your symptoms by restoring balance in your body and restoring your body's inherent ability to heal and regulate. Each treatment course is different and individually tailored to your needs and specific situation.       Most insurances cover osteopathic services. We look forward to serving your Osteopathic Manual Medicine needs.

## 2023-01-12 NOTE — Progress Notes (Signed)
 PATIENT: Gordon Huang  MRN: [id number]  DOB: Oct 25, 1990  DATE OF SERVICE:     TITLE: Outpatient Rheumatology Consultation    Referring Physician: Self, Referred  PMD: Dy, Diane Jazmin    Reason for Consult:     HPI: Gordon Huang is a 32 year old male who was referred to Long COVID. COVID 08/2018, end of 2021, 11/2021 (2 other infections, 1st in AUG 2020--mild, like a bad cold, sick for a week and a half, then again end of 2021, sick for almost a month--fatigue, SOB, no taste or smell). c/b sig fatigue, PEM, neurocog sx (memory issues, confusion, mental acuity), decrease endurance, exercise intol. Mild OSA. Doing pulm rehab but some PEM. Likes going to pulm rehab. Hasn't been able to work. Not getting emotional support from dad. Applying for in home care. Feeling down. Hypersomnolence and worsening depression/anxiety. Sees therapist 2x/wk.     S/p surgery for prior broken shoulder and fall exacerbating his sx. Occ dry rash/bumps at fingers. No oral ulcers/alopecia. +occ dry eyes but no dry eyes. +SOB. Chest tightness. ?mild RP w/ white/red feelings. Needs circulating air to sleep at night. No photosensitivity. Some joint pain at R>L knees. Right arm feels tired, been using it more after left shoulder surgery. Feels like he has to crack his joints all the time (knees/neck). +CLBP ever since breaking it 2015 (L2-4 compression fracture) during depression/situational things. Occ dry skin under left eye      ROS: 14-point ROS was negative other than as noted above.    PMH:  Left shoulder surgery/arthroscopy 10/2022 for broken shoulder 2009  Bipolar  Anxiety  Depression w/ h/o SI  Acute dystonic reaction due to drugs 05/2022  SVT s/p ablation 2016  ADHD    Allergies:   Allergies   Allergen Reactions    Beef Extract Other and Itching     All Red-Meat Products    Nsaids Itching       Medications:   H/o LDN 4mg  04/2022, felt better but was off for shoulder surgery 10/2022; helps w/ brain fog; restarted last  week  H/o cymbalta -> sig wt gain and anger  H/o s-ketamine prior to surgery but had to stop     Lamictal   Concerta   Truvada  Wellbutrin   naprosyn     Outpatient Medications Marked as Taking for the 01/12/23 encounter (Office Visit) with Lillard Reichmann, MD   Medication Sig Dispense Refill    (TRUVADA) emtricitabine  200 mg-tenofovir  disoproxil 300 mg tablet Take 1 tablet by mouth daily.      buPROPion  (WELLBUTRIN  XL) 300 MG XL tablet Take 1 tablet (300 mg) by mouth daily.      docusate sodium  (COLACE) 250 MG capsule Take 1 capsule (250 mg) by mouth 2 times daily. 20 capsule 0    lamoTRIgine  (LAMICTAL ) 150 MG tablet Take 1 tablet (150 mg) by mouth 2 times daily.      methylphenidate  (CONCERTA ) 54 MG Controlled-Release tablet Take 1 tablet (54 mg) by mouth daily.      naloxone  (NARCAN ) 4 mg/0.1 mL nasal spray FOR SUSPECTED OVERDOSE, call 911! Tilt head, spray one spray into one nostril as needed for respiratory depression. If patient does not respond or responds and then relapses, repeat using a new nasal spray TO THE OTHER NOSTRIL every 3 minutes until emergency medical assistance arrives. 2 each 0    naltrexone  (DEPADE) 50 MG tablet Take 1 tablet (50 mg) by mouth. Compound into 50mL of water and 50mg  = 4ml  naproxen  (NAPROSYN ) 500 MG tablet Take 1 tablet (500 mg) by mouth 2 times daily as needed (pain). 60 tablet 1    naproxen  (NAPROSYN ) 500 MG tablet Take 1 tablet (500 mg) by mouth 2 times daily (with meals). Okay to stop medication once pain and swelling well controlled. 40 tablet 0       FHx: No known autoimmune disease.  family history includes Asthma in his father; COPD in his father; Other in his father.    SocHx:    reports that he quit smoking about 7 years ago. His smoking use included cigarettes. He started smoking about 18 years ago. He has a 5.5 pack-year smoking history. He has been exposed to tobacco smoke. He has quit using smokeless tobacco.    Physical Exam:   Vital Signs:   BP 119/72 (BP  Location: Right arm, BP Patient Position: Sitting, BP cuff size: Regular)   Pulse 100   Temp 98.2 F (36.8 C) (Temporal)   Resp 18   Ht 5\' 8"  (1.727 m)   Wt 72.6 kg (160 lb)   SpO2 97%   BMI 24.33 kg/m     GEN: NAD. Left arm in a sling  CV: RRR, No M/R/G.  Chest: CTAB.  Abd: S/NT/ND.  Ext: No C/C/E.   MSK: No active synovitis.   Skin: No rash.       Labs: [] reviewed [] ordered.  No results found for: "WBC", "HGB", "HCT", "PLT", "NEUTABS"  No results found for: "GLUCOSE", "CREAT", "CALCIUM", "TOTPRO", "ALBUMIN", "AST", "ALT", "ALKPHOS"  No components found for: "PROTCLUR", "BLDUR", "LEUKESTUR", "RBCSUR", "WBCSUR"  No results found for: "CRP"  No results found for: "C3", "C4", "DRVVT", "SMAB", "SSA", "CANCA", "PANCA", "MPOAB"      Studies: [] reviewed [] ordered [] independently reviewed.  01/2022 MRI brain neg     A/P:  A) DEPRESSION/ANXIETY/BIPOLAR, PASC (brain fog, PEM, fatigue, arthralgia): pt w/ 3 acute COVID infections c/b constellation of sx all c/w PASC. Was getting some improvement with LDN when he had to stop for shoulder surgery and opioid use. On min dose of 0.5mg  LDN currently. Despite arthralgia, no clear e/o underlying inflammatory arthritis or CTD.    Large numbers of patients who have been infected with SARS-CoV-2 continue to experience a constellation of symptoms long past the time that they've recovered from the initial stages of COVID-19 illness. Often referred to as "Long COVID", these symptoms, which can include fatigue, shortness of breath, "brain fog", sleep disorders, fevers, gastrointestinal symptoms, anxiety, and depression, can persist for months and can range from mild to incapacitating. In some cases, new symptoms arise well after the time of infection or evolve over time.   While still being defined, these effects can be collectively referred to as Post-Acute Sequelae of SARS-CoV-2 infection (PASC).  Treatments for PASC are limited, and focus on symptom management and comprehensive  physical and respiratory rehab.    -per Dr. Donna Fus: MDIs, sleep clinic for possible OSA  -echo pending  -reassured that there's no e/o inflammatory arthritis  -discussed potential role of TMS for both depression and PASC; pt will think about it and let me know Davida Espy)  -coQ10, B12, cordyceps, mycobrain per Dr. Clemente Cushing  -interested in peer support group  -pt already failed cymbalta due to AEs; would not rec elavil given multiple other psych meds and underlying psych history      The above plan of care, diagnosis, orders, and follow-up were discussed with the patient. Questions related to this recommended plan of care were  answered.     Thank you for referring this patient to me. Please feel free to contact me with any questions.     60 min spent on patient care/record review.      Marylouise Socks, M.D.

## 2023-01-12 NOTE — Progress Notes (Signed)
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 he seeks feedback regarding his conventional treatment options as well.          No data to display                Pre-Visit Chart Review:    Conducted, including, but not limited to:        Doctor Gordon Huang 04/18/22 shoulder consultation  "He is also currently on long-term disability/permanent disability for long COVID."  RELEVANT HISTORY AND RISK FACTORS:  Long COVID with symptoms of headache, spots in vision, difficulty breathing on exertion and talking, and 50 lb weight loss.  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  History of bipolar disease.   06/02/22 Emergency Room visit:  Gordon Sit, MD - 06/02/2022 3:25 PM PDT  Formatting of this note is different from the original.  Gordon Huang ED Provider Note    Chief Complaint  Patient presents with  Spasms    HPI: Patient is 32 y.o. male presenting for uncontrollable movements that started yesterday. He is accompanied by friend. He was in [place] on a choir trip. He takes Wellbutrin, naltrexone, Concerta, Lamictal and Truvada. He also had an edible but states that his symptoms started before he took the edible yesterday. He thought that he was anxious. He has had uncontrolled movements of his entire body since  then. No history of recent trauma. No fever or chills. No cough. No other recent illness.    Additional history obtained from : friend    Allergy: is allergic to ibuprofen.    Medications:  Current Outpatient Medications  Medication Instructions  diphenhydrAMINE (BENADRYL) 25 mg, oral, Every 6 hours    Past Medical History: has a past medical history of Long COVID and SVT (supraventricular tachycardia) (HCC). Posttraumatic syndrome, bipolar disorder, status post ablation for SVT     NEUROLOGIC: Cranial nerves II through XII are grossly intact.   Motor and sensation are intact., Frequent jerking motions of extremities and head     MDM/ED Course:  This is a complex and high risk patient presenting with uncontrollable movements since yesterday. Differential diagnoses considered include possible dystonic reaction, movement disorder, rhabdomyolysis, dehydration, electrolyte abnormality.    Patient was placed in a monitored bed and workup was initiated for symptoms. Was initially given oral Benadryl for his dystonic reaction and symptoms persisted. He was given additional oral Ativan. Lab work was sent and results were independently reviewed by me. Patient was having persistent uncontrollable movements and was given additional IV Benadryl and hydration with IV normal saline. While being observed in the ED, patient was not able to urinate. Despite fluid resuscitation, patient was still unable to urinate and a Foley catheter was placed with more than 900 cc  of urinary output. At this point, I felt the patient needed to keep the Foley catheter for his urinary retention but patient refused. He states that he does not want it and I have explained to him that if he is not able to urinate, he may need another Foley catheter. He states he understands and would like the Foley catheter removed. His dystonic movements resolved after treatment. His movements might have been secondary to his antipsychotic medications that he is  taking. All results were discussed with the patient. Urine toxicology screen was positive for cannabis. Patient is instructed to abstain from any new medications as well as from any illicit drug use. He is advised to follow-up with his primary care physician for reevaluation and return for any recurrent or worsening symptoms. Again, he has refused to keep the Foley catheter and it has been removed. Patient will be prescribed Benadryl for his dystonic movements. Patient is clinically stable and improved at this time for discharge.   Glasgow Coma Scale Score: 15    Diagnoses as of 06/03/22 1757   Acute dystonic reaction due to drugs   Urinary retention     Outside/past records reviewed include(s): the patient's outpatient medication list    Shared decision making: I have engaged the patient and friend in shared decision making    Data reviewed and interpreted include : Labs, Radiological images, and EKG    Clinical Impression:  1. Acute dystonic reaction due to drugs   2. Urinary retention     Disposition:  Discharge     Medications and Referrals:  ED Prescriptions     Medication Sig Dispense Start Date End Date Auth. Provider   diphenhydrAMINE (BENADryl) 25 mg tablet Take 1 tablet (25 mg) by mouth every 6 hours for 5 days. 20 tablet 06/02/2022 06/07/2022 Gordon Sit, MD     Gordon Mick Sell, MD  [address]  [place] [address]    In 1 week     08/15/22 Gordon Huang Speech Vocational Therapy:  RBANS (Repeatable Battery for the Assessment of Neuropsychological Status; Record Form D)   Immediate Memory  Index Score: 69  Percentile: 2nd (Below Average)    Visuospatial/Constructional  Index Score: 58  Percentile: .2 (Below Average)      Language *results pending  Index Score:   Percentile:     Attention *results pending  Index Score:  Percentile:     Delayed Memory  Index Score: 52  Percentile: .1 (Below Average)    Total Scale:   Index Score:   Percentile:     [place] Post-Covid Questionnaire is a pt  self-reported questionnaire spanning multiple illnesses to gain insight into pt's perception of his deficits. Scores are rated from 0 (no problem) to 3 (severe problem/affects daily life/distracting).   Breathlessness  Now: 6  Pre-Covid 1  Cough & Voice Changes  Now: 5  Pre-Covid 4  Fatigue   Now: 2  Pre-Covid 1  Smell/Taste   Now: 2  Pre-Covid 0  Pain  Now: 12  Pre-Covid: 3  Cognition  Now: 9  Pre-Covid: 3  Palpations/Dizziness  Now: 1  Pre-Covid: 0  Post Exertional Malaise  Now: 2  Pre-Covid: 1  Anxiety/mood  Now: 10  Pre-Covid: 4  Sleep  Now: 3  Pre-Covid: 1  Communication   Now: 3  Pre-Covid: 1  Walking  Now: 1  Pre-Covid: 0  Other ADLS  Now: 2  Pre-Covid: 1  Social   Now: 3  Pre-Covid: 1  Assessment: Notably higher scores across domains post-COVID w/most severe scoring/self reporting noted in the areas of cognition & mood.     SLP Education  Education Topic: SLP goals and treatment plan, Home program training  Response: Verbal understanding    Additional Session Information  Additional Findings: Pt tearful at times during eval; reports he feels like he is "stuck" with no purpose. SLP expressed concerns w/psychosocial factors impacting cogntion & potential barriers to progress in speech tx, but pt reports he is supported from a mental health standpoint    Assessment   SLP Assessment  SLP Problem List/Condition Description: Impaired cognitive linguistic function  Assessment - SLP: At least mild cognitive communication deficits & word finding deficits; RBANS assessment initated & to be completed next session. Noted disorganized thought organization/formulation, loss of train of thought & poor short term memory. Pt reported signficant functional impact on daily functioning/QOL on outcome measure. Rec skilled SLP tx w/focus on cognitive compensatory strategies to improve functional independence & facilitate return to baseline activities  Prognosis - SLP: Good    Plan  Plan - SLP: Skilled SLP  services  Considerations for Next Tx Session: REC: Dx/tx via RBANS to further define goal areas  Treatment/Interventions - SLP: Cognitive linguistic tasks  Frequency and Duration: x1 a week for 8 visits     10/20/22 Long Covid Clinic Dr. Regino Schultze:  ASSESSMENT AND PLAN      Bracy Mcclelland is a 32 year old male who was evaluated in [facility] today   His exercise tolerance remains reduced after last COVID infection.  His symptoms are not suggestive of post viral asthma.  His exam today did not quite meet criteria for POTS although he may have some dysautonomia which would be consistent with his fatigue and post exertional malaise     There is likely some deconditioning as well.     Recommend starting with PFTs and ECHO  Referral to Pulmonary Rehab placed.\     Given otherwise normal exam today, there is no absolute contraindication to proceeding with surgery.    = = = = = = = = =  HPI   32 y/o referred by Dr. Merilynn Huang in anticipation of possible shoulder surgery.  Had previously been deemed in MAR 2023 not to be a good candidate for surgery due to his "Long COVID" symptoms. Subsequently was cleared by his "COVID" doctor     Injured shoulder in 2009  Reinjured shoulder in 2017  It now becomes dislocated with only minimal movement     Pertinent medical issues:  Afib treated with ablation in 2016  Bipolar, s/p multiple suicidal attempts      On:   wellbutrin  Naltrexone (4 mg)--since April 2024, improves brain fog  Concerta  Lamictal and Truvada     ?h/o childhood asthma     In APR 2022 he was evaluated at [facility] for cough SOB and lightheadedness  Elevated LFTs noted  He was given Advair but has not taken this in years     Date of positive test: NOV 2023 (2 other infections, 1st in AUG 2020--mild, like a bad cold, sick for a week and a half, then again end of 2021, sick for almost a month--fatigue, SOB, no taste or smell)     Symptoms at onset: most recent infection was worst of  all, could not get out of bed, could not walkup or down stairs without stopping  By MAY 2024 still unable to  complete going upstairs without stopping or getting lightheaded  Gets tired very easily  Does not sleep well  He just got a new mattress     Ain JUL 2024, he was evaluated by [facility]  At that time, he reported cognitive dysfunction and was noted to have mild word finding difficulty at the conversational level  Delayed memory was observed  Considerations for Next Tx Session: REC: Dx/tx via RBANS to further define goal areas  Treatment/Interventions - SLP: Cognitive linguistic tasks  Frequency and Duration: x1 a week for 8 visits     Hospitalized?  No, monitored at home     COVID Treatments:   paxlovid     CURRENT SYMPTOMS     GENL  GENLpostcovid: Fatigue: yes, Fevers/chills:yes, or Night sweats: no     QOL  MOBILITY -- has problems walking about  SELF-CARE -- has problems washing or dressing self USUAL ACTIVITIES -- has problems doing usual activities (work, study, housework, family or leisure activities) PAIN/DISCOMFORT -- has pain or discomfort    GI  GI POST COVID: diarrhea  Heartburn more than once a week but   Bad morning breath     RHEUM  Joint pains? Right knee clicks, exp when going downstairs, tennis elbow on the right due to overcompensation  Body Aches/Myalgias:yes  Had a back in jury in 2015 broke L2,3,5  Larey Seat out of a 2nd story window  Chronic back pain both sides     NEURO  headaches     SLEEP--snores  Usual bedtime (lights out): 2 am  Reported sleep latency (average):   Nocturnal awakenings: 1-3 times/night (but he just got a new mattress)  Reported sleep latency after nocturnal awakening (average):  anywhere from a few minutes to an hour  Wake up time (lights on):   Feeling refreshed upon awakening: NO  Total perceived sleep time (average):   Afternoon Naps: sometimes, because he is exhausted              ==========================    HPI Today:  Dr. Mertha Baars at [facility] prescribes  his Naltrexone - he refers to her as his "Covid doctor"    His primary symptoms of concern today are:  1.  Fatigue & hypersomnolence    2.  Shortness of breath "always"    3.  Irritable mood 2/2 to fatigue  ADHD    4.  Brain Fog - memory and also  Naltrexone helped this but not fatigue or mood   Resumed 0.5 milligram Naltrexone    Told not to go over 4 milligrams/day by Dr. Carlynn Purl    ADHD and memory are worse too, LDN didn't help    5.  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 Max 1-2/month, most months none  Water: lots   Eating Habits: sporadic, no clear schedule like breakfast, lunch, and dinner     Exercise: limited to Pulm Rehab    Sleep: as above     Social  Support:     Health Practices:     Supplements: (in addition to prescribed medications)   All from [employer]:  Multivitamin One a day Men's  Magnesium unsure  if 400mg  Nature Made  Vitamin D3  (? 1,000 international units Unsure of dose) "green bottle"    Prior CAM use:      Restorative Practices:      Emotional and Spiritual Health:      General Emotional State:    Spiritual Practices:      High Risk Behaviors:  None; See Below for habits as in EPIC fields       Outpatient Medications Marked as Taking for the 01/12/23 encounter (Office Visit) with Serena Colonel, MD   Medication Sig Dispense Refill    (TRUVADA) emtricitabine 200 mg-tenofovir disoproxil 300 mg tablet Take 1 tablet by mouth daily.      buPROPion (WELLBUTRIN XL) 300 MG XL tablet Take 1 tablet (300 mg) by mouth daily.      docusate sodium (COLACE) 250 MG capsule Take 1 capsule (250 mg) by  mouth 2 times daily. 20 capsule 0    lamoTRIgine (LAMICTAL) 150 MG tablet Take 1 tablet (150 mg) by mouth 2 times daily.      LORazepam (ATIVAN) 1 MG tablet Take 1 tablet (1 mg) by mouth every 4 hours as needed for Anxiety.      methylphenidate (CONCERTA) 54 MG Controlled-Release tablet Take 1 tablet (54 mg) by mouth daily.      naloxone (NARCAN) 4 mg/0.1 mL nasal spray FOR SUSPECTED OVERDOSE, call 911! Tilt head, spray one spray into one nostril as needed for respiratory depression. If patient does not respond or responds and then relapses, repeat using a new nasal spray TO THE OTHER NOSTRIL every 3 minutes until emergency medical assistance arrives. 2 each 0    naltrexone (DEPADE) 50 MG tablet Take 1 tablet (50 mg) by mouth. Compound into 50mL of water and 50mg  = 4ml      naproxen (NAPROSYN) 500 MG tablet Take 1 tablet (500 mg) by mouth 2 times daily as needed (pain). 60 tablet 1    naproxen (NAPROSYN) 500 MG tablet Take 1 tablet (500 mg) by mouth 2 times daily (with meals). Okay to stop medication once pain and swelling well controlled. 40 tablet 0        Allergies to medications:  Beef extract and Nsaids  No past medical history on file.  No past surgical history on file.  Family History   Problem Relation Name Age of Onset    Asthma Father      COPD Father      Other Father          TTP     Social History     Socioeconomic History    Marital status: Divorced     Spouse name: Not on file    Number of children: Not on file    Years of education: Not on file    Highest education level: Not on file   Occupational History    Not on file   Tobacco Use    Smoking status: Former     Average packs/day: 0.5 packs/day for 11.0 years (5.5 ttl pk-yrs)     Types: Cigarettes     Start date: 2006     Quit date: 2017     Years since quitting: 8.0     Passive exposure: Past    Smokeless tobacco: Former    Tobacco comments:  Mother smoked   Vaping Use    Vaping status: Former    Substances: Nicotine, THC   Substance and Sexual  Activity    Alcohol use: Not on file    Drug use: Not on file    Sexual activity: Not on file   Other Topics Concern    Not on file   Social History Narrative    Born and raised:[place], moved to [place] end of 2019    Pets:6 cats in his household    Coffee:1 cup daily    Carbonated beverages:less than once a week, special events only        Dinner: "lunch" is around 5-6, 11 at night    ZHY:QMVHQI 2 am    Snacks?:something small so that he can take his medication     Social Determinants of Health     Financial Resource Strain: Not on file   Food Insecurity: Unknown (01/29/2022)    Received from [facility], [employer] Insecurity     In the past 3 months, have you had to go without food for 24 hours, multiple times due to lack of resources?: Not on file   Transportation Needs: Unknown (01/29/2022)    Received from [facility], [facility]    Transportation Needs     In the past 3 months, has lack of transporation kept you from medical appointments or getting things you need that are essential to your health?: Not on file   Physical Activity: Sufficiently Active (11/20/2017)    Received from [facility], [facility]    Exercise Vital Sign     Days of Exercise per Week: 4 days     Minutes of Exercise per Session: 60 min   Stress: Not on file   Social Connections: Unknown (03/20/2022)    Received from [facility], [facility]    Social Connections     In the past 3 months, do you feel that you lack companionship or social support?: Not on file   Intimate Partner Violence: Not on file   Housing Stability: Not on file       I did review the available past medical, family, and social history from the electronic records.     OBJECTIVE:    BP 119/72 (BP Location: Right arm, BP Patient Position: Sitting, BP cuff size: Regular)   Pulse 100   Temp 98.2 F (36.8 C) (Temporal)   Resp 18   Ht 5\' 8"  (1.727 m)   Wt 72.6 kg (160 lb)   SpO2 97%   BMI 24.33 kg/m  Body mass index is 24.33 kg/m.  Well  Developed, Well nourished male in No Acute Distress  Affect: normal, neither anxious nor depressed. Grooming: appropriate   Communication was clear, not at all difficult to understand / tangential / pressured / slowed speech   Head: normocephalic, atraumatic  Eyes: anicteric OU  Neuro: Oriented, alert, cooperative, Gait: normal  Skin: exposed areas free of rash and no significant lesions    Pertinent Labs & Imaging, other studies / procedures: reviewed      ASSESSMENT & PLAN:  Encounter Diagnoses   Name Primary?    Post-COVID chronic fatigue Yes    Post-COVID chronic concentration deficit     Takes dietary supplements     Post-COVID-19 syndrome manifesting as chronic neurologic symptoms      Consider other diagnoses and  Other contributing conditions, including, but not limited to toxins  Could benefit from  Better diet  Total avoidance of alcohol,  OMT and   from Acupuncture    Also he Could benefit from  CoQ-10 (Coenzyme Q-10/ ubiquinol) and  Cordycpeps  Better PACING & Sleep    Orders Placed This Encounter   Procedures    Lead, Whole Blood Royal Blue-EDTA    Mercury, Blood Royal Blue    Vitamin B12, Blood Green Plasma Separator Tube    Osteopathic Manual Medicine Clinic    Acupuncture Clinic       I personally spent 85 total minutes in face-to-face and non-face-to-face activities related to the  patient's visit today, excluding any separately reportable services/procedures, specifically:    Preparing to see patient   Obtaining and/or reviewing history  Performing a medically appropriate examination  Counseling and/or education  Ordering tests, medications, or procedures  Documenting clinical information in the record  Independent interpretation of results (not separately reported)  Communication of results  Care coordination      No follow-ups on file. 3-6 months in [facility]    Patient Instructions   Weston Brass,    It was a pleasure working with you    The supplements that I mentioned in our visit are listed  below    Consider trying each of them all at once, or individually if you prefer.  If you try one at a time, begin with CoQ-10 (Coenzyme Q-10 - ubiquinol) for a week,     Followed by Cordyceps in addition to the above for the 2nd week,     then in the 3rd week, continue both the above, and add the  [employer] Brain.    Take a dose of 200mg  or 300mg  or 400mg  every AM of CoQ10.    Brands of CoQ-10 (Coenzyme Q-10/ ubiquinol) I recommend are:    Annabelle Harman  NOW   [employer]  Protocol for Life Balance    [employer] sells these online and in several natural stores around town:    Cordyceps "Energy" - take 2 capsules before noon    "MycoBrain" - two capsules a day, either together or divided.    www.hostdefense.com    Patient Instructions   Tips on long covid and fatigue from Dr. Roselyn Bering    Maximize quality of sleep and  Quantity of sleep    Go to sleep earlier.    Pacing activity is especially important.    Pacing - American ME and CFS Society (ammes.org)     "Staying within the "energy envelope," or pacing, is one way a person with ME/CFS can help mitigate PEM. Avoiding activities which will place too great a strain on the body's ability to produce adequate ATP is a key strategy of pacing, but adequate rest is equally as important. What is important in pacing is to avoid crossing the energy threshold.  Knowing when you have crossed the energy threshold is tricky. In part, it depends on how ill a person is. A severely ill patient crosses the energy threshold daily simply by being ill. However, mildly and moderately ill patients may not even be aware when they have "done too much." For these patients, it is important to adjust activities to avoid the worst effects of PEM.  The website link above provides some important strategies to help patients pace.   = = = = =   Rest/pace:  A low mitochondria reserve has demonstrated in ME/CFS Jesse Sans et al, PLOS one 2017)  - Pre-emptive rest  before engaging in  physical or stimulatory activity can be helpful.  - Avoid energy crashes by limiting the actions that will exacerbate the symptoms of ME/CFS. Remember those events/crashes can occur days after overdoing physical or stimulation activities.  - Tracking your activities (daily steps, time spent, heart rate, use of electronics, etc.) by using monitoring tools such as your smartphone, smartwatch, etc. can be Very helpful to learn your limits    Avoid all alcohol    Include 5 different vegetables or fruits every day  Variety - 20 per week  Spices count!    CoQ10 could be a helpful supplement for you  200 or 300mg  daily while you are taking the statin  Select a good brand   Shop carefully online for supplements as we discussed    Check out the book "the Long Covid Solution" by Royanne Foots MD    Thank you.    Remember, I will be very delayed in replying to messages.    - Corwin Levins MD      You have been referred to the [facility] for [facility]ative [facility] at [place]   Acupuncture and   Department of Osteopathic Manual Medicine for a clinic consult. Please call to schedule an appointment at your earliest convenience.  Insurance varies from person to person and you may also need an insurance authorization prior to being seen.     Acupuncture & Osteopathic Manual Medicine Department scheduling phone number:  [phone]     Osteopathic Manual Medicine:  Doctors of Osteopathy (DO) go through educational training similar to allopathic (MD) doctors, and are similarly licensed to practice a full scope of medicine in any specialty of choice. DOs are also trained in a variety of different manual medicine procedures - using their hands and touch to diagnose and treat you. They may work on your bones, joints, muscles, fascia, nerves, lymphatics, circulation and more over the course of a treatment. Osteopathic manual medicine can improve your health and alleviate your symptoms by restoring balance in your body and  restoring your body's inherent ability to heal and regulate. Each treatment course is different and individually tailored to your needs and specific situation.       Most insurances cover osteopathic services. We look forward to serving your Osteopathic Manual Medicine needs.    Diagnosis, available results of Labs/tests (if any) reviewed with patient and any questions answered.  Barriers to Learning assessed: none. Patient verbalizes understanding of teaching and instructions.    Relevant goal-setting conducted today for above condition(s).    See follow up as entered into the EPIC field    Electronically signed by  Corwin Levins MD

## 2023-01-12 NOTE — Progress Notes (Signed)
[place] [facility] Visit      ---------------------(data below generated by Skipper Cliche, MD)--------------------    I personally spent total 60 minutes in face-to-face and non-face-to-face activities  related to the patient's visit today, excluding any separately reportable  services/procedures       Demographics:  Medical Record #: [id number]  Date: January 12, 2023  Patient Name: Gordon Huang  DOB: 1990/11/15  Age: 32 year old  Sex: male       ASSESSMENT AND PLAN     Gordon Huang is a 32 year old male who was evaluated in [facility] today.    Main issues are profound fatigue, decreased endurance and exercise tolerance associated with post exertional malaise as well as cognitive dysfunction and problems with concentration and attention, confusion, forgetfulness, difficulty understanding what others are saying, reduced mental acuity, and mental fatigue     Has had mutliple sleep tests  Previously found to have mild OSA which is seen on more recent HST  Fatigue and sleepiness appear out of proportion to mild OSA seen on current testing.  Will start APAP 5 -15 cm Hwo  Sleep referral placed.    lab workup for post covid fatigue ordered  Echocardiogram is pending    Decreased exercise and endurance may in part be related to not well controlled asthma  No evidence of resp muscle weakness on exam but he yawns and sighs frequently  Will start SYMBICORT (BUDESONIDE-FORMOTEROL) 2 puffs BID and in addition, 1-2 puffs up to every 6 hrs as needed for cough, wheezing, chest tightness or shortness of breath. Not to exceed 12 puffs daily.      Inhaler instructions provided today:     The plan was carefully reviewed verbally with the patient and also affirmed that the patient understood next steps and follow up plan.    I am seeing this patient for ongoing, long-term evaluation management of their Long COVID condition.         HPI     32 y/o evaluated in  [facility] in SEP 2024  With reduced exercise tolerance after last COVID infection.    referred by Dr. Merilynn Finland in anticipation of possible shoulder surgery.  Had previously been deemed in MAR 2023 not to be a good candidate for surgery due to his "Long COVID" symptoms. Subsequently was cleared by his "COVID" doctor     Injured shoulder in 2009  Reinjured shoulder in 2017  It now becomes dislocated with only minimal movement     Pertinent medical issues:  Afib treated with ablation in 2016  Bipolar, s/p multiple suicidal attempts      On:   wellbutrin  Naltrexone (4 mg)--since April 2024, improves brain fog  Concerta  Lamictal and Truvada     ?h/o childhood asthma     In APR 2022 he was evaluated at [facility] for cough SOB and lightheadedness  Elevated LFTs noted  He was given Advair but has not taken this in years     Date of positive test: NOV 2023 (2 other infections, 1st in AUG 2020--mild, like a bad cold, sick for a week and a half, then again end of 2021, sick for almost a month--fatigue, SOB, no taste or smell)     Symptoms at onset: most recent infection was worst of all, could not get out of bed, could not walkup or down stairs without stopping  By MAY 2024 still unable to complete going  upstairs without stopping or getting lightheaded  Gets tired very easily  Does not sleep well  He just got a new mattress     In JUL 2024, he was evaluated by [facility]  At that time, he reported cognitive dysfunction and was noted to have mild word finding difficulty at the conversational level  Delayed memory was observed  Considerations for Next Tx Session: REC: Dx/tx via RBANS to further define goal areas  Treatment/Interventions - SLP: Cognitive linguistic tasks  Frequency and Duration: x1 a week for 8 visits     In SEP 2024, His symptoms were not suggestive of post viral asthma.  His exam today did not quite meet criteria for POTS although he may have some dysautonomia which would be consistent with his  fatigue and post exertional malaise     There is likely some deconditioning as well.     We Recommended starting with PFTs and ECHO  Referral to Pulmonary Rehab placed.\    Interim:  Started pulmonary rehab NOV 2024   293 M  SPO2: 100  Heart Rate : 100  Patient Rated Breathlessness: 5  Patient Rated Muscle Fatigue: 4  Patient Rated pain: 4  Pain Location : left leg cramp  Sit to Stand 5 times: 12.58 seconds    01/12/2023    SLEEP  Usual bedtime (lights out):   Between midnight and 2 am  Reported sleep latency (average):  5-10 minutes  Nocturnal awakenings:   sometimes  Reported sleep latency after nocturnal awakening (average):  around 20 minutes  Wake up time (lights on):   1030 - 11  Feeling refreshed upon awakening:  No, for past 3 weeks, feels crappy when he wakes up  Total perceived sleep time (average): up to 11 hours  Afternoon Naps:    yes    Sleep aids now or in past:   magnesium    When he wakes up in the am  He often coughs  With mucus in his throat    STOPPED LDN 1 week prior to surgery  Then restarted 1 week ago    Lives in a townhome with 3 flights of stairs  After going up and down twice, he is winded  Review of Systems:  As noted in HPI Review of Systems - All others negative    Past Medical History:  Active Ambulatory Problems     Diagnosis Date Noted    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    Post-COVID chronic decreased mobility and endurance 10/21/2022     Resolved Ambulatory Problems     Diagnosis Date Noted    No Resolved Ambulatory Problems     No Additional Past Medical History         Medications were reviewed and updated on the Active Medication List     Psychosocial:  Social History     Tobacco Use    Smoking status: Never Smoker    Smokeless tobacco: Never Used   Substance Use Topics     Alcohol use: Not Currently     Comment: socially    Drug use: Not on file        Allergies   Allergen Reactions    Percocet [Apap-Fd&C Blue #1-Oxycodone] Itching       The Family History:  Family  History     Family History   Problem Relation Name Age of Onset    Asthma Father      COPD Father      Other Father          TTP             Physical Exam:  GENERAL: alert, no distress Use of accessory muscles YES/NO: no  MENTAL STATUS: Full and Appropriate  HEENT: EOMI, Sclera clear, anicteric, and Neck supple with midline trachea  CHEST PHYSICAL FINDINGS: no audible wheezing. Bilat chest excursion symmetric  ABDOMEN INSPECTION: flat  EXTREMITY LOCATION: lower extremities without edema  BEHAVIORAL: Reports  mild Anxiety.  NEUROLOGICAL: Gait normal. Moves all four extremities spontaneously. Speech normal.  SKIN: without clubbing, cyanosis or rash    LABS    Lab Results   Component Value Date    WBC 5.0 07/30/2019    HGB 11.6 07/30/2019    HCT 38.0 07/30/2019    PLT 386 (H) 07/30/2019     Lab Results   Component Value Date    TSH 1.31 07/30/2019     Lab Results   Component Value Date    ABSEOS 0.0 07/30/2019     No results found for: CO19@  No results found for: CRP, ESR, IL6SERUM, FERRITIN  Lab Results   Component Value Date    BUN 7 07/30/2019    CREAT 0.75 07/30/2019    NA 142 07/30/2019    K 4.0 07/30/2019    CL 104 07/30/2019    BICARB 28 07/30/2019    AST 25 07/30/2019    ALT 14 07/30/2019    ALK 107 07/30/2019         IMAGING/STUDIES:   I have personally reviewed and analyzed labs, PFTs and imaging studies      Pulmonary    PFTS    Dec 15 2022 HST        CARDIOLOGY  ECHO    ECG    Palpitations or tachycardia> [facility]      NEUROLOGY   MMSE      Brain Fog> MRI            Orders this encounter:  @ORDERDX @   No orders of the defined types were placed in this encounter.

## 2023-01-12 NOTE — Patient Instructions (Signed)
 Because neither proton pump inhibitors (PPIs) nor H2 blockers actually block or prevent reflux,    We recommend Gaviscon Advance Liquid   It is not available OTC in the [place], and can be purchased online  1-2 tsp at bedtime (and/or 30 minutes after meals)

## 2023-01-14 LAB — MERCURY, BLOOD: Mercury: 3.6 ug/L (ref <=10.0)

## 2023-01-14 LAB — ADRENOCORTICOTROPIC HORM., BLOOD: Adrendocorticotropic Horm: 21.3 pg/mL (ref 7.2–63.3)

## 2023-01-15 ENCOUNTER — Ambulatory Visit (HOSPITAL_BASED_OUTPATIENT_CLINIC_OR_DEPARTMENT_OTHER): Payer: Medicaid Other

## 2023-01-15 ENCOUNTER — Ambulatory Visit: Payer: Medicaid Other | Attending: Pulmonary Disease

## 2023-01-15 VITALS — BP 119/84 | HR 88

## 2023-01-15 DIAGNOSIS — U099 Post covid-19 condition, unspecified: Secondary | ICD-10-CM | POA: Insufficient documentation

## 2023-01-15 DIAGNOSIS — R0609 Other forms of dyspnea: Secondary | ICD-10-CM | POA: Insufficient documentation

## 2023-01-15 LAB — LEAD, WHOLE BLOOD: Lead, Whole Blood: <1 ug/dL (ref 0.0–3.4)

## 2023-01-15 NOTE — Interdisciplinary (Signed)
[place] [facility]/OP Respiratory Therapy Services  Date: Thursday January 15, 2023    Gordon Huang presents today for Patient Education and Exercise Therapy    Time: 7829-5621          Patient Name: Gordon Huang  Gender: male  Age: 33 year old  Referring Physician: Skipper Cliche  Referring Diagnosis: The encounter diagnosis was Post-COVID chronic dyspnea.  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   O: Vitals are within patient's normal limits.      Patient Education:   Patient Education  Education Provided: No education provided this session.    Exercise Today:  Resting Vitals  Resting Vital Signs  Blood pressure (BP): 119/84  BP Location: Right arm  Heart Rate: 88  SpO2: 100 %  Measurement Type : Finger  Oxygen: Room Air         Lower body Endurance Training   Recumbent Elliptical: Yes  Resistance Level : 1  Steps/Min/Watts/METS: 54  Duration in Minutes : 10  Breathlessness: 8  Muscle Fatigue  : 6  Pain scale : 0  Heart Rate : 88  SpO2: 98  O2 Prescribed: No  Measurement Type : Finger  Change: Decreased from  Decreased from comment:: 15 minutes    Strength Training   Strength Therapy : Yes - Upper Body;Yes - Lower Body  UB Free Weight #'s : 2 (Right side only)  UB Reps: 10  UB Sets: 1  Change: Maintained  LB Free Weight #'s : 2  LB Reps: 10  LB Sets: 1  LB Standing Hamstring curls : No  LB Sit to stands: No  LB Seated side Steps : No  Change: Maintained  Comments: Patient performed 8 calm pumps.         Supervised Exercise Session Time: 15 Minutes    Summary:  Patient's session consisted of education, strength, and endurance training.  Please refer above synopsis of today's exercise session.  Patient expressed feeling really limited due to his current residual symptoms from long covid-19. He communicates the holidays  are further highlighting his psychosocial obstacles and expressed his financial burden has been very limiting as well.  Oxygen was continuously monitored throughout exercise session to maintain saturations greater than/equal to 90% on room air.    Plan:  Patient was scheduled for session with Dr. Clarene Essex to discuss his current mental health struggles.  Maintain upper/lower body strength training with 2 upper & 2 lower lbs. at 1 set of 12 reps.   Decrease lower body endurance with Recumbent Cross trainer Elliptical  at #0 resistance for 10 minutes or to symptoms report 3-4 Moderate to Somewhat Severe PB/PF.  Make therapeutic changes to patient's exercise prescription per symptoms at the time of session.      [name], RCP

## 2023-01-19 ENCOUNTER — Encounter (HOSPITAL_BASED_OUTPATIENT_CLINIC_OR_DEPARTMENT_OTHER): Payer: Self-pay | Admitting: Hospital

## 2023-01-22 ENCOUNTER — Ambulatory Visit: Payer: Medicaid Other | Attending: Pulmonary Disease

## 2023-01-22 VITALS — BP 120/71 | HR 88

## 2023-01-22 DIAGNOSIS — U099 Post covid-19 condition, unspecified: Secondary | ICD-10-CM | POA: Insufficient documentation

## 2023-01-22 DIAGNOSIS — R0609 Other forms of dyspnea: Secondary | ICD-10-CM | POA: Insufficient documentation

## 2023-01-22 NOTE — Interdisciplinary (Signed)
  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 He communicated no changes in baseline symptoms since previous session.    O: Patient's vital signs were within his normal range.     Patient Education:   Patient Education  Education Provided: SELF-CARE MANAGEMENT/EMERGENCY PLANNING: Instructed on developing an action plan to monitor changes in baseline symptoms and when to report to physician vs urgent care or ER/ED. Instructions on emergency planning.;LUNG MEDICATIONS: Evaluate and trained to use prescribed lung medications for optimal benefit.    Exercise Today:  Resting Vitals  Resting Vital Signs  Blood pressure (BP): 120/71  BP Location: Right arm  Heart Rate: 88  SpO2: 99 %  Measurement Type : Finger  Oxygen: Room Air       Lower body Endurance Training   Recumbent Elliptical: Yes  Resistance Level : 0  Steps/Min/Watts/METS: 64 (64 spm/ 7 watts)  Duration in Minutes : 5  Breathlessness: 7  Muscle Fatigue  : 5  Pain scale : 3  Pain Location : Chest pain (3), Total body numbness (9)  Heart Rate : 91  SpO2: 100  O2 Prescribed: No  Measurement Type : Finger  Change: Decreased from  Decreased from comment:: #1 resistance    Strength Training   Strength Therapy : Yes - Upper Body;Yes - Lower Body  UB Free Weight #'s : 2 (Right arm only)  UB Reps: 12  UB Sets: 1  Change: Increased from  Increased from comment:: 10 reps  LB Free Weight #'s : 2  LB Reps: 12  LB Sets: 1  LB Standing Hamstring  curls : No  LB Sit to stands: No  LB Seated side Steps : Yes  LB Seated side Steps Reps: 12  LB Seated side Steps Sets: 1  Change: Increased from  Increased from comment:: 10 reps    Flexibility/Stretching   Flexibility/Stretching : Not Performed    Supervised Exercise Session Time: 35 Minutes    Summary:  Patient's session consisted of education, strength, and endurance training.  Please refer above synopsis of today's exercise session.  Patient rested multiple times during strength training due to SOB, lightheadedness, total body numbness/"tingling", and extremity coldness. Rated severity of symptoms between 6-8 out of 10.   During lower body endurance, patient requested to end at 5 minutes due to experiencing the same symptoms as mentioned above. He requested to have a blanket placed on his body during lower body endurance due to extremity coldness with exertion.   Observed patient to have consistent yawning during exertion.   Oxygen was continuously monitored throughout exercise session to maintain saturations greater than/equal to 90% on room air.    Plan:  Maintain upper/lower body strength training with 2 right upper & 2 lower lbs. at 1 set of 12 reps.   Increase lower body endurance with Recumbent Cross trainer Elliptical  at #0 resistance for 7-9 minutes or to symptoms report 2-3 Slight to Moderate PB/PF.  Make therapeutic  changes to patient's exercise prescription per symptoms at the time of session.  Education for next session will consist of Nutrition.       [place], [place]

## 2023-01-29 ENCOUNTER — Ambulatory Visit: Payer: Medicaid Other | Attending: Pulmonary Disease

## 2023-01-29 VITALS — BP 123/75 | HR 77

## 2023-01-29 DIAGNOSIS — R0609 Other forms of dyspnea: Secondary | ICD-10-CM | POA: Insufficient documentation

## 2023-01-29 DIAGNOSIS — U099 Post covid-19 condition, unspecified: Secondary | ICD-10-CM | POA: Insufficient documentation

## 2023-01-29 NOTE — Interdisciplinary (Signed)
 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     O: Patient vital signs within baseline limits.  Patient arrives with left arm in sling to support post surgery, currently attending PT twice weekly.    WT: 155 lb    Patient Education:   Patient Education  Education Provided: NUTRITION: Patient trained on impact of nutrition and on overall health and disease prognosis. Enforce appropriate hydration.;SELF-CARE MANAGEMENT/EMERGENCY PLANNING: Instructed on developing an action plan to monitor changes in baseline symptoms and when to report to physician vs urgent care or ER/ED. Instructions on emergency planning.    Exercise Today:  Resting Vitals  Resting Vital Signs  Blood pressure (BP): 123/75  BP Location: Right arm  Heart Rate: 77  SpO2: 99 %  Measurement Type : Finger  Oxygen: Room Air       Lower body Endurance Training   Recumbent Elliptical: Yes  Resistance Level : 0  Steps/Min/Watts/METS: 46  Duration in Minutes : 10  Breathlessness: 7  Muscle Fatigue  : 5  Pain scale : 3  Pain Location : tingling light headedness  Heart Rate : 100  SpO2: 100  O2 Prescribed: No  Measurement Type : Finger  Change: Increased from  Increased from comment:: 5 minutes    Strength Training   Strength Therapy : Yes - Upper Body;Yes - Lower Body  UB Free Weight #'s : 2  UB Reps: 12  UB Sets: 1  Change: Maintained  LB Free Weight #'s : 2  LB Reps: 12  LB Sets: 1  LB Standing Hamstring curls : No  LB Sit  to stands: No  LB Seated side Steps : No  Change: Decreased from  Increased from comment:: 12 reps     Supervised Exercise Session Time: 50 Minutes    Summary:  Patient's session consisted of education, strength, and endurance training.  Please refer above synopsis of today's exercise session.  Patient continues to be limited by fatigue, lightheadedness and leg tingling sensation.  Patient gave a good effort and with exercise session.  Patient reports he has multiple medical appointments monthly and continues to be followed by post COVID clinic.  Oxygen was continuously monitored throughout exercise session to maintain saturations greater than/equal to 90% on room air.    Plan:  Increase upper/lower body strength training with 2 upper & 2 lower lbs. at 2 set(s) of 8-12 reps.   Increase lower body endurance with Recumbent Cross trainer Elliptical  at 0-1 resistance for 15-20 minutes or to symptoms report 2-3 Slight to Moderate PB/PF.  Make therapeutic changes to patient's exercise prescription per symptoms at the time of session.  Education for next session will consist of Emergency Planning, Home Exercises, and Energy Saving Techniques.     Elwood Hammonds, RCP

## 2023-02-05 ENCOUNTER — Ambulatory Visit: Payer: Medicaid Other | Attending: Pulmonary Disease

## 2023-02-05 VITALS — BP 125/78 | HR 84 | Wt 159.0 lb

## 2023-02-05 DIAGNOSIS — Z7409 Other reduced mobility: Secondary | ICD-10-CM

## 2023-02-05 DIAGNOSIS — U099 Post covid-19 condition, unspecified: Secondary | ICD-10-CM | POA: Insufficient documentation

## 2023-02-05 DIAGNOSIS — R0609 Other forms of dyspnea: Secondary | ICD-10-CM | POA: Insufficient documentation

## 2023-02-05 NOTE — Interdisciplinary (Signed)
 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    O: Patient's vital signs were within his normal range.     Initial and Current outcomes:  SOBQ: 52; Score: 51  CAT: 22; CAT Total Score : 19     Dyspnea Reassessment:   Dyspnea Reassessment  Dyspnea Initial Assessment Date: 12/12/22  Uses Breathing Techniques: Purse Lip breathing;Box Breathing;ADLs  PLB Scale: Improved, requires cue <50%  BB Scale: Independently  Dyspnea Reassessment Comments : Reviewed breathing techniques per patient request and provided written materials.    Oxygen Reassessment:  Oxygen Reassessment  Oxygen Reassessment Evaluation : No prescribed supplemental oxygen    Exercise Rehab Reassessment:  Exercise Reassessment in Rehab  Frequency: Once a week  Endurance Training: Lower body  Endurance Mode: Recumbent Crosstrainer Elliptical  Recumbent Initial resistance and duration: #1/ 10 min  Recumbent current resistance and duration: #0/ 10 min  Strength Training: Upper body;Lower body  Initial Upper body sets, reps & load: 1 set/ 8 reps/ 2 lbs (Right arm only)  Current upper body sets, reps & load: 1 set/ 13 reps/ 2 lbs (Right arm only)  Initial lower body sets, reps & load: 1 set/ 8 reps/ 2 lbs  Current lower body sets, reps & load: 1 set/ 13 reps/ 2  lbs  O2 Sat Level: Monitor & Maintain O2 Sat at 90% or >  Prescribed supplemental oxygen with exertion: No  Exercise Reassessment Comments : Decreased resistance of lower body endurance due to patient's post exertional fatigue after  previous sessions on #1 resistance.  SpO2: 100 %  Measurement Type : Forehead  Oxygen: Room Air             Exercise Home Reassessment:  Exercise Reassessment in Home  Comments: Patient stated he meets with his physical therapist twice a week and completes the same range of motion exercises 2 additional times each week.    Psychosocial Reassessment:  Psychosocial Reassessment  Identifies coping strategies and stress management techniques: Progressing  Improved management of depression, anxiety, and/or stress related quality of life : Progressing  PHQ9 Patient Initial Summary Score : 11  Psychosocial Reassessment Comments : Patient stated his current therapist who he sees twice a week will be transferring locations. He has yet to schedule appointment with a new therapist. He endorses adherence to psychosocial medication prescribed by psychiatrist. Stated feeling a greater sense of accomplishment, was recently approved for software development program. Patient's PHQ-9 score is less compared to initial evaluation. He answered 0 on question #9. Patient declined notifying PCP due to established care with psychiatrist. Patient denies thoughts and denies plans of self-harm.  Thoughts that you would be better off dead, or of hurting yourself in some way: Not at all  PHQ9 Patient Summary Score (calculated): 10       OTHER CORE COMPONENTS:  Nutrition Reassessment:  Verbalizes understanding of impact of weight and nutrition on overall disease prognosis, physical activity, and mobility: Progressing  Has identified goals for attaining healthy body weight: Not indicated  Has Identified methods to improve ease of food preparation/shopping: Progressing  Has understanding of disease specific nutritional  guidelines: Progressing  Has understanding of food intake and hydration guidelines for Pre and Post workout: Progressing  Met with Dietician/Nutritionist: No  Nutrition Reassessment Comments: Patient communicated fair appetite, consumes 1 meal prepared by his partner and snacks throughout the day, and endorses adequate hydration to help alleviate his headaches.  Initial Height: 5' 8 (1.727 m)   & WT 159 lbs  Current Height: 5' 8 (1.727 m)   & Weight: 72.1 kg (159 lb)      Cough Reassessment:  Cough/Bronchial Hygiene Reassessment  Patient demonstrates effective cough: Yes  Secretion management: No Change  Cough Reassessment Comments : Patient continues to have a daily productive cough each morning and swallows his secretions.    Smoking Reassessment:  Smoking Reassessment  Smoking Status: Remains Nonsmoker    Sleep Quality Reassessment:  Sleep Quality Reassessment  Sleep Quality: Poor  Has understanding of importance with equipment use: Other: (See comments)  Has understanding of primary sleep disturbance: Progressing  Has understanding of importance on sleep hygiene: Progressing  Sleep Study Obtained: Yes  Diagnosis of OSA: New diagnosis of OSA  OSA (obstructive sleep apnea) : Yes  Reported use : Identified Barriers to use  Sleep Quality Reassessment Comment: Patient stated he was informed by Dr. Cesario to have sleep apnea. Patient still has yet to schedule with Pulmonary sleep before receiving his unit.    Inhaled Medications Use Reassessment:  Inhaled Medication Use Reassessment  Has understanding of correct time and frequency: Progressing  Has understanding of correct technique: Progressing  Adheres to prescription: Progressing  Demonstrates proper cleaning and care: Progressing  Medication Changes: Yes  What Changes?: Patient is newly prescribed Symbicort .    Self-Care Management Reassessment:  Self Care Management Reassessment  Has understanding of respiratory infection prevention management:  Progressing  Respiratory Infection Exacerbation Risk Comment: Patient communicated understanding of early symptom reporting and monitoring. He denies experiencing respiratory infections/exacerbations since start of program.  Last date of unplanned hospital readmission/ER visit due to a pulmonary exacerbation: N/A  Recent exacerbation: No  Travel Education  Travel Education comment:: Patient communicated no plans to travel at this time.    Exercise Today  Resting Vitals:  Resting Vital Signs  Blood pressure (BP): 125/78  BP Location: Right arm  Heart Rate: 84  SpO2: 100 %  Measurement Type : Forehead  Oxygen: Room Air        Strength Training   Strength Therapy : Yes - Upper Body;Yes - Lower Body  UB Free Weight #'s : 2 (Right arm only)  UB Reps: 13  UB Sets: 1  Change: Increased from  Increased from comment:: 12 reps  LB Free Weight #'s : 2  LB Reps: 13  LB Sets: 1  LB Standing Hamstring curls : No  LB Sit to stands: No  Change: Increased from  Increased from comment:: 12 reps    Flexibility/Stretching   Flexibility/Stretching : Not Performed    6 MINUTE WALK TEST  6 MWT  Purpose for walk: Reassessment  Pre 6 MWT:  Resting Vitals:  Blood pressure (BP): 125/78  BP Location: Right arm  Heart Rate: 84  SpO2: 100 %  Measurement Type : Forehead  Oxygen: Room Air    Rests  Rest Duration (in Seconds) : 0    Observation  Pursed Lips Breathing : No  Accessory Muscle Use : Yes  Shoulders Elevated : Yes  Balance: Adequate  Gait: Steady  Assistive Devices: No    Post 6 MWT  Summary  Total Distance (m): 313  MPH: 1.94  SPO2: 100  Heart Rate : 105  Patient Rated Breathlessness: 4  Patient Rated Muscle Fatigue: 2  Patient Rated pain: 0  Sit to Stand 5 times: 12.77 seconds    Patient Education:   Patient Education  Education Provided: BREATHING RETRAINING: Provided patient with skills training of pursed lip and diaphragmatic breathing to improve shortness of breath with exertion and when performing daily activities.  Comment::  Reviewed breathing techniques per patient request and provided written materials.    Supervised Exercise Session Time: 55 Minutes    REASSESSMENT SUMMARY:  Shawndell Schillaci is a 33 year old year old male, referred to [facility] by Cesario Jon Pyles of [place], for monitored exercise and education.   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      Mabel Aleene Riemann's session consisted of strength training with outcome measures obtained. Please refer to Outcomes and Exercise sections for summary of today's session.   Patient has attended 7 sessions thus far in Pulmonary Rehab.  Patient does not report a higher tolerance for performing ADL's since starting PR.  Patient communicated he is more mindful of his breathing.   Patient has progressed with his rehab exercise modalities since start of care. Please refer to Exercise Reassessment section for a summary.     Oxygen was continuously monitored throughout exercise session to maintain saturations greater than/equal to 90% on room air.    PLAN:  Maintain upper/lower body strength training with 2 upper & 2 lower lbs. at 1 set of 12 reps.   Increase lower body endurance with Recumbent Cross trainer Elliptical  at #0 resistance for 12-15 minutes or to symptoms report 3-4 Moderate to Somewhat Severe PB/PF.  Make therapeutic changes to patient's exercise prescription per symptoms at the time of session.  Education for next session will consist of Home Exercises.       [place], [place]

## 2023-02-12 ENCOUNTER — Telehealth (HOSPITAL_BASED_OUTPATIENT_CLINIC_OR_DEPARTMENT_OTHER): Payer: Self-pay | Admitting: Pulmonary Disease

## 2023-02-12 ENCOUNTER — Ambulatory Visit: Payer: Medicaid Other | Attending: Pulmonary Disease

## 2023-02-12 VITALS — BP 125/78 | HR 97

## 2023-02-12 DIAGNOSIS — U099 Post covid-19 condition, unspecified: Secondary | ICD-10-CM | POA: Insufficient documentation

## 2023-02-12 DIAGNOSIS — R0609 Other forms of dyspnea: Secondary | ICD-10-CM | POA: Insufficient documentation

## 2023-02-12 NOTE — Telephone Encounter (Signed)
 Pt has called In to schedule f/u with the Long Covid clinic. Please assist. Thank you    CB [phone]

## 2023-02-12 NOTE — Interdisciplinary (Addendum)
 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     O: Patient arrives with left arm in sling, vital signs within baseline limits.    Patient Education:   Patient Education  Education Provided: HOME EXERCISE BASICS:  Patient was educated on the components of a comprehensive home exercise program (HEP) and given safe guidelines with implementing a HEP.    Exercise Today:  Resting Vitals  Resting Vital Signs  Blood pressure (BP): 125/78  BP Location: Left arm  Heart Rate: 97  SpO2: 97 %  Measurement Type : Finger  Oxygen: Room Air     Lower body Endurance Training   Recumbent Elliptical: Yes  Resistance Level : 0  Steps/Min/Watts/METS: 47  Duration in Minutes : 21  Breathlessness: 4  Muscle Fatigue  : 2  Pain scale : 3  Pain Location : chest tightness  Heart Rate : 101  SpO2: 100  O2 Prescribed: No  Measurement Type : Finger  Change: Increased from  Increased from comment:: 10 minutes    Strength Training   Strength Therapy : Yes - Upper Body;Yes - Lower Body  UB Free Weight #'s : 2  UB Reps: 13  UB Sets: 1  Change: Maintained  LB Free Weight #'s : 2  LB Reps: 13  LB Sets: 1  LB Standing Hamstring curls : No  LB Sit to stands: No    Flexibility/Stretching   Flexibility/Stretching : Not Performed    Supervised Exercise Session Time: 60 Minutes    Summary:  Patient's session consisted of education, strength, and endurance training.  Please refer above synopsis of today's exercise  session.  Patient continue to work with PT twice weekly to address left arm and shoulder pain  Oxygen was continuously monitored throughout exercise session to maintain saturations greater than/equal to 90% on room air.    Plan:  Increase upper/lower body strength training with 2 upper & 2 lower lbs. at 2 set(s) of 8 reps.   Increase lower body endurance with Recumbent Cross trainer Elliptical  at 0-1 resistance for 25-30 minutes or to symptoms report 2-3 Slight to Moderate PB/PF.  Make therapeutic changes to patient's exercise prescription per symptoms at the time of session.  Education for next session will consist of Self-Care Management, Bronchial Hygiene, and Home Exercises.       Arlean Velia Hayward, RCP

## 2023-02-13 ENCOUNTER — Encounter (INDEPENDENT_AMBULATORY_CARE_PROVIDER_SITE_OTHER): Payer: Self-pay | Admitting: Pulmonary Disease

## 2023-02-13 ENCOUNTER — Encounter (INDEPENDENT_AMBULATORY_CARE_PROVIDER_SITE_OTHER): Payer: Self-pay | Admitting: Family Practice

## 2023-02-13 ENCOUNTER — Encounter (INDEPENDENT_AMBULATORY_CARE_PROVIDER_SITE_OTHER): Payer: Self-pay | Admitting: Internal Medicine

## 2023-02-13 DIAGNOSIS — U099 Post covid-19 condition, unspecified: Secondary | ICD-10-CM

## 2023-02-13 NOTE — Telephone Encounter (Signed)
 Spoke to patient and scheduled [facility] return visit for 2/26.

## 2023-02-18 ENCOUNTER — Ambulatory Visit (HOSPITAL_BASED_OUTPATIENT_CLINIC_OR_DEPARTMENT_OTHER): Payer: Medicaid Other

## 2023-02-18 VITALS — BP 121/73 | HR 88

## 2023-02-18 DIAGNOSIS — R0609 Other forms of dyspnea: Secondary | ICD-10-CM

## 2023-02-18 NOTE — Interdisciplinary (Signed)
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   O: Patient's vital signs were within his normal range.        Patient Education:   Patient Education  Education Provided: No education provided this session.    Exercise Today:  Resting Vitals  Resting Vital Signs  Blood pressure (BP): 121/73  BP Location: Left arm  Heart Rate: 88  SpO2: 100 %  Measurement Type : Finger  Oxygen: Room Air       Lower body Endurance Training   Recumbent Elliptical: Yes  Resistance Level : 1  Steps/Min/Watts/METS: 53 (53 spm/ 8 watts)  Duration in Minutes : 25  Breathlessness: 4  Muscle Fatigue  : 3  Pain scale : 0  Heart Rate : 110  SpO2: 98  O2 Prescribed: No  Measurement Type : Finger  Change: Increased from  Increased from comment:: #0 resistance    Strength Training   Strength Therapy : Yes - Upper Body;Yes - Lower Body  UB Free Weight #'s : 2  UB Reps: 8  UB Sets: 1  Change: Decreased from  Increased from comment:: 13 reps  LB Free Weight #'s : 2  LB Reps: 8  LB Sets: 1  LB Standing Hamstring curls : No  LB Sit to stands: No  LB Seated side Steps : No  Change: Decreased from  Decreased from comment:: 13 reps  Comments: Per patient request to end strength training early due to breathlessness rated 4 and muscle fatigue rated 4.    Flexibility/Stretching   Flexibility/Stretching : Not Performed    Supervised Exercise Session Time: 72 Minutes    Summary:  Patient's session  consisted of strength and endurance training.  Please refer above synopsis of today's exercise session.  Per patient request to end strength training early due to breathlessness rated 4 and muscle fatigue rated 4. Observed excessive yawning during strength training.   Patient expressed feelings of sadness frustration with his symptom burden and personal life struggles. Stated he is scheduled to meet with a new therapist for talk therapy this Friday. Patient knows he is scheduled to meet with Pulmonary Rehab's clinical psychologist Dr. Talbert Forest on Thursday February 6th.     Oxygen was continuously monitored throughout exercise session to maintain saturations greater than/equal to 90% on room air.    Plan:  Increase upper/lower body strength training with 2 upper & 2 lower lbs. at 1 set of 10-12 reps.   Increase lower body endurance with Recumbent Cross trainer Elliptical  at #1 resistance for 27 minutes or to symptoms report 2-3 Slight to Moderate PB/PF.  Make therapeutic changes to patient's exercise prescription per symptoms at the time of session.       [place], [place]

## 2023-02-19 ENCOUNTER — Ambulatory Visit (HOSPITAL_BASED_OUTPATIENT_CLINIC_OR_DEPARTMENT_OTHER): Payer: Medicaid Other

## 2023-02-26 ENCOUNTER — Ambulatory Visit: Payer: Medicaid Other | Attending: Pulmonary Disease

## 2023-02-26 VITALS — BP 116/76 | HR 77

## 2023-02-26 DIAGNOSIS — R0609 Other forms of dyspnea: Secondary | ICD-10-CM | POA: Insufficient documentation

## 2023-02-26 DIAGNOSIS — U099 Post covid-19 condition, unspecified: Secondary | ICD-10-CM | POA: Insufficient documentation

## 2023-02-26 NOTE — Interdisciplinary (Signed)
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 No changes in symptoms and/or medications since last session.   O: Vitals within normal range for patient.    Exercise Today:  Resting Vital Signs  Blood pressure (BP): 116/76  BP Location: Left arm  Heart Rate: 77  SpO2: 98 %  Measurement Type : Finger  Oxygen: Room Air         Lower body Endurance Training   Recumbent Elliptical: Yes  Resistance Level : 1  Duration in Minutes : 25  Breathlessness: 3  Muscle Fatigue  : 3  Pain scale : 0  Heart Rate : 99  SpO2: 98  O2 Prescribed: No  Measurement Type : Finger  Change: Maintained    Strength Training   Comments: Strength training was deferred due to time constraints.         Supervised Exercise Session Time: 25 Minutes    Summary:  Patient's session consisted of education, strength, and endurance training.  Please refer above synopsis of today's exercise session.    Oxygen was continuously monitored throughout exercise session to maintain saturations greater than/equal to 90% on room air.    Plan:  Increase upper/lower body strength training with 2 upper & 2 lower lbs. at 1 set(s) of 10 reps.   Increase lower body endurance with Recumbent Cross trainer Elliptical  at 1 resistance for 30 minutes or to symptoms report 3-4 Moderate to Somewhat Severe PB/PF.  Make therapeutic changes to patient's exercise prescription per symptoms at the time of session.    Jeryl Columbia Thong Feeny RCP, RRT

## 2023-02-27 ENCOUNTER — Encounter (INDEPENDENT_AMBULATORY_CARE_PROVIDER_SITE_OTHER): Payer: Self-pay | Admitting: Hospital

## 2023-03-05 ENCOUNTER — Ambulatory Visit (HOSPITAL_BASED_OUTPATIENT_CLINIC_OR_DEPARTMENT_OTHER): Payer: Medicaid Other | Admitting: Psychology

## 2023-03-05 ENCOUNTER — Ambulatory Visit: Payer: Medicaid Other | Attending: Pulmonary Disease

## 2023-03-05 VITALS — BP 117/79 | HR 93 | Wt 155.0 lb

## 2023-03-05 DIAGNOSIS — U099 Post covid-19 condition, unspecified: Secondary | ICD-10-CM | POA: Insufficient documentation

## 2023-03-05 DIAGNOSIS — R0609 Other forms of dyspnea: Secondary | ICD-10-CM | POA: Insufficient documentation

## 2023-03-05 NOTE — Interdisciplinary (Signed)
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  Patient's Chief Complaint: Shortness of breath, fatigue, and trouble focusing       DAILY EXERCISE  S: No changes with patients baseline symptoms and medications.   O: Vitals are within his normal limits.    Initial and Current outcomes:  SOBQ: 52; Score: 56  CAT: 22; CAT Total Score : 22     Dyspnea Reassessment:   Dyspnea Reassessment  Dyspnea Initial Assessment Date: 12/12/22  Uses Breathing Techniques: Purse Lip breathing;Box Breathing  PLB Scale: Independently  BB Scale: Independently    Oxygen Reassessment:  Oxygen Reassessment  Oxygen Reassessment Evaluation : No prescribed supplemental oxygen    Exercise Huang Reassessment:  Exercise Reassessment in Huang  Frequency: once a week  Endurance Training: Lower body  Endurance Mode: Recumbent Crosstrainer Elliptical  Recumbent Initial resistance and duration: #1 resistance, 10 minutes  Recumbent current resistance and duration: #1 resistance, 25 minutes  Strength Training: Upper body;Lower body  Initial Upper body sets, reps & load: 1 set, 8 reps, 2 lbs  Current upper body sets, reps & load: 1 set, 8 reps, 2 lbs  Initial lower body sets, reps & load: 1 set, 8 reps, 2 lbs  Current lower body sets, reps & load: 1 set, 8 reps, 2 lbs  O2 Sat Level: Monitor & Maintain O2 Sat at 90% or >  Prescribed supplemental oxygen with exertion: No     SpO2: 100 %  Oxygen: Room Air     Prescribed supplemental oxygen with exertion: NO    Exercise Home Reassessment:  Exercise Reassessment in Home  Comments: patient stated he walks twice a week for 20 minutes and goes to PT twice a  week.    Psychosocial Reassessment:  Psychosocial Reassessment  Identifies coping strategies and stress management techniques: Progressing  Improved management of depression, anxiety, and/or stress related quality of life : Progressing  PHQ9 Patient Initial Summary Score : 13  Psychosocial Reassessment Comments : patient stated he still experiences anxiety, but continues to see a therapist/psychatrist. He had an appointment with Arpi today and stated his session went well. Elevated PHQ9, but per eval visit, patient prefers for PCP not to be notified due to on going treatment with his psychiatrist.  Thoughts that you would be better off dead, or of hurting yourself in some way: (!) Several days  PHQ9 Patient Summary Score (calculated): 13     OTHER CORE COMPONENTS:  Nutrition Reassessment:  Nutrition Reassessment  Has Identified methods to improve ease of food preparation/shopping: Progressing  Has understanding of disease specific nutritional guidelines: Progressing  Has understanding of food intake and hydration guidelines for Pre and Post workout: Progressing  Met with Dietician/Nutritionist: No  Weight: 70.3 kg (155 lb)  Initial Height: 5\' 8"  (1.727 m)   & Weight: 70.3 kg (155 lb)    Current Height: 5\' 8"  (1.727 m)   & Weight 155 lbs    Cough Reassessment:  Cough/Bronchial Hygiene Reassessment  Patient demonstrates effective cough: Yes  Secretion management: No Change  Cough Reassessment Comments : patient continues to have a productive cough every morning with white/yellow sputum    Smoking Reassessment:  Smoking Reassessment  Smoking Status: Quit Smoking (2016)    Sleep Quality Reassessment:  Sleep Quality Reassessment  Sleep Quality: Poor  Sleep Study Obtained: Yes  Diagnosis of OSA: New diagnosis of OSA  OSA (obstructive sleep apnea) : Yes  Sleep Quality Reassessment Comment: patient stated he was diagnosed with sleep apnea, but is waiting for an appointment with pulmonary sleep before recieving a cpap  machine.    Inhaled Medications Use Reassessment:  Inhaled Medication Use Reassessment  Has understanding of correct time and frequency: Progressing  Has understanding of correct technique: Progressing  Adheres to prescription: Progressing  Demonstrates proper cleaning and care: Progressing  Medication Changes: No    Self-Care Management Reassessment:  Self Care Management Reassessment  Has understanding of respiratory infection prevention management: Progressing  Last date of unplanned hospital readmission/ER visit due to a pulmonary exacerbation: n/a  Recent exacerbation: No    Exercise Today  Resting Vitals:  Resting Vital Signs  Blood pressure (BP): 117/79  BP Location: Left arm  Heart Rate: 93  SpO2: 100 %  Measurement Type : Forehead  Oxygen: Room Air    6 MINUTE WALK TEST  6 MWT  Purpose for walk: Reassessment    Pre 6 MWT:  Resting Vitals:  Blood pressure (BP): 117/79  BP Location: Left arm  Heart Rate: 93  SpO2: 100 %  Measurement Type : Forehead  Oxygen: Room Air    Observation  Pursed Lips Breathing : No  Accessory Muscle Use : No  Shoulders Elevated : No  Balance: Fair  Gait: Steady  Assistive Devices: No  Comments:: right foot points outward    Post 6 MWT  Summary  Total Distance (m): 286  MPH: 1.7  SPO2: 95  Heart Rate : 100  Patient Rated Breathlessness: 4  Patient Rated Muscle Fatigue: 0  Patient Rated pain: 0  Pain Location : 0  Sit to Stand 5 times: 14.55 seconds      Supervised Exercise Session Time: 35 minutes    REASSESSMENT  Summary:  Gordon Huang is a 33 year old year old male, referred to [facility] by Skipper Cliche of [place], for monitored exercise and education. 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  Dimas Scheck Pelaez's session consisted of strength and endurance training with outcome measures obtained. Please refer to Outcomes and Exercise sections for summary of today's session.   Patient has attended 11 sessions thus far in Pulmonary  Huang.  Patient has progressed in some areas with his Huang exercise modalities since start of care. Please refer to Exercise Reassessment section for a summary.     Patient stated that he feels less sob after learning the breathing techniques.     Oxygen was continuously monitored throughout exercise session to maintain saturations greater than/equal to 90% on room air.    Plan:  Increase upper/lower body strength training with 2 upper & 2 lower lbs. at 1 set of 10 reps.   Increase lower body endurance with Recumbent Cross trainer Elliptical  at #1 resistance for 30 minutes or to symptoms report 3-4 Moderate to Somewhat Severe PB/PF.  Make therapeutic changes to patient's exercise prescription per symptoms at the time of session.      Cassandra Pugeda-Lascano, RCP

## 2023-03-05 NOTE — Progress Notes (Signed)
PULMONARY REHABILITATION PSYCHOLOGIST NOTE    S:  "The fatigue and brain fog are the worst of it. 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  Alertness and Orientation:  Alert, Ox3, NAD  Attitude: cooperative and engaged  Psychomotor Agitation/Retardation: WNL   Mood:  "frustrated"  Affect: generally euthymic, full-range, appropriate to content  Speech:   elaborative, interruptible  Thought Process: goal-oriented  Thought Content: no e/o unusual thought content  Suicidal Ideation: endorses ongoing longstanding thoughts about death, adamantly denies intent or plan to harm self  Homicidal Ideation: no e/o  Insight/Judgment:  both intact      Braysen Cloward was informed about confidentiality and limits to confidentiality within a therapeutic relationship.   Mr. Flahive acknowledged understanding this information and consented to the session.  I spent 50 minutes with patient doing CBT and ACT interventions for mood.      A:  Mr. Meenach reports ongoing significant fatigue, dyspnea, and brain fog which have made it difficult for him to function as he did pre-COVID.  Also describes sleep difficulties.  Shares that others, including those in the medical profession, have sometimes seemed disbelieving of his medical condition which is upsetting.  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  Endorses longstanding thoughts of death such as "I wish I wouldn't wake up" but denies having any intention or plan to hurt himself.  Reports he currently sees a psychiatrist, is in the process of a comprehensive neuropsych evaluation, and also has a psychotherapist he sees regularly.  Reports good support from his friend with whom he lives.  We discussed the frustrations associated with long COVID symptoms and others' reactions which are not always supportive.  Discussed that the symptoms can disrupt one's sense of  identity thus it's important to retain meaningful activities that bring joy and purpose, even if those activities have to be modified.  Mr. Marcotte provided examples such as modifying his baking practices and doing self-directed courses in [employer].  Discussed grounding exercises such as 5-4-3-2-1 for episodes of deja-vu.  Reviewed some sleep hygiene strategies.  We also reviewed safety planning should he develop thoughts of self-harm.  Went over the Coping with Stress after COVID handout I provided him and briefly went over the online cognitive rehab program [facility] that may be of interest to him.  Mr. Nohr expressed appreciation for the session, voiced intention to continue following with his regular mental health providers.       P:    1:  As above, focus on aspects of Mr. Rajewski identity that are core to his values and he can honor by modifying activities that he has enjoyed or found meaningful.  2:  Grounding strategies as discussed above, and consider cognitive rehabilitation such as [facility].  3:  He will follow up with his regular therapist and also complete his neuropsych evaluation as well as continue psychotropic med management.      Clarene Essex, Ph.D. PID [id number]  Faculty Psychologist

## 2023-03-10 ENCOUNTER — Ambulatory Visit (INDEPENDENT_AMBULATORY_CARE_PROVIDER_SITE_OTHER): Payer: Medicaid Other | Admitting: Sports Medicine

## 2023-03-10 DIAGNOSIS — M25312 Other instability, left shoulder: Secondary | ICD-10-CM

## 2023-03-10 NOTE — Progress Notes (Signed)
 [facility] DEPARTMENT OF ORTHOPAEDIC SURGERY, SPORTS MEDICINE    PRIMARY CARE PROVIDER:   Dy, Lavonia Drafts    Procedure:   Left shoulder arthroscopy, anterior labral repair, capsulorrhaphy,  remplissage that is repair of infraspinatus tendon into Hill-Sachs lesion.     DOS:   November 19, 2022    HISTORY: Gordon Huang is a 33 year old male who presents today for evaluation now 4 months post op.  He is overall doing fairly well with pain.  However, he is very protect of the arm.  He is still using his sling most of the time when he is out of the house.  He is working with a physical therapist on range of motion.  He has not really started strength.    Past medical history, medications, allergies are unchanged.    PHYSICAL EXAM:  VITALS: There were no vitals taken for this visit.  GENERAL: A+Ox3. INAD. Well appearing and well groomed. Normal gait  MENTAL STATUS: Pleasant and cooperative. Alert and oriented x3 with normal mood and affect.  Vascular: Pulses are palpable 2+, capillary refills to digits are normal.  Skin: No wounds/lesions/ulcers. Skin warm & dry.  Respiratory: No respiratory distress    MUSCULOSKELETAL:    Shoulder: Left  Normal posture.  Shoulder: No swelling.  Incisions are healed  Range of Motion:  His forward flexion is 100, external rotation of 10, internal rotation to back pocket.  Strength is 5-/5 in the supraspinatus, infraspinatus, subscapularis.  Sensation intact to light touch in the bilateral upper extremities.  2+ radial pulses.    ==========    ASSESSMENT & PLAN: Gordon Huang is a 33 year old male 4 months status post left shoulder arthroscopy, anterior labral repair, capsulorrhaphy, remplissage.  He is doing reasonably well.  We discussed that he is being somewhat conservative with the arm and that it is safe to use, particularly in daily activities.  I strongly encouraged him to continue working on his range of motion.  The shoulder is very stable but he will likely have ongoing  stiffness unless he is able to do more in physical therapy.  He understands.  He is also dealing with some other health issues.  We will have him continue physical therapy and see him back in 2 months.     Patient verbalizes understanding of all discussions today, and all questions were answered satisfactorily.

## 2023-03-10 NOTE — Patient Instructions (Signed)
Recheck in 2 months.      Continue to work on range of motion.  Goal is to have near full range of motion by 6 months postoperative.  Push herself a little bit more in all planes of motion.  In physical therapy, 75% of the time she would be spent on mobility and soft tissue work such as joint mobilization and 25% on strength.

## 2023-03-12 ENCOUNTER — Telehealth (HOSPITAL_BASED_OUTPATIENT_CLINIC_OR_DEPARTMENT_OTHER): Payer: Self-pay | Admitting: Pulmonary Disease

## 2023-03-12 ENCOUNTER — Ambulatory Visit (HOSPITAL_BASED_OUTPATIENT_CLINIC_OR_DEPARTMENT_OTHER): Payer: Medicaid Other

## 2023-03-12 NOTE — Telephone Encounter (Signed)
 Spoke to pt about missing apt today he stated he tried calling but couldn't get through (no VM's left) he's not feeling well with the rainy weather and needs to stay home.

## 2023-03-16 ENCOUNTER — Encounter (INDEPENDENT_AMBULATORY_CARE_PROVIDER_SITE_OTHER): Payer: Self-pay | Admitting: Pulmonary Disease

## 2023-03-19 ENCOUNTER — Ambulatory Visit (HOSPITAL_BASED_OUTPATIENT_CLINIC_OR_DEPARTMENT_OTHER): Payer: Medicaid Other

## 2023-03-25 ENCOUNTER — Encounter (INDEPENDENT_AMBULATORY_CARE_PROVIDER_SITE_OTHER): Payer: Medicaid Other | Admitting: Pulmonary Disease

## 2023-03-25 ENCOUNTER — Encounter (INDEPENDENT_AMBULATORY_CARE_PROVIDER_SITE_OTHER): Payer: Medicaid Other | Admitting: Family Practice

## 2023-03-25 ENCOUNTER — Encounter (INDEPENDENT_AMBULATORY_CARE_PROVIDER_SITE_OTHER): Payer: Medicaid Other | Admitting: Internal Medicine

## 2023-03-26 ENCOUNTER — Ambulatory Visit (HOSPITAL_BASED_OUTPATIENT_CLINIC_OR_DEPARTMENT_OTHER): Payer: Medicaid Other

## 2023-04-02 ENCOUNTER — Ambulatory Visit: Payer: Medicaid Other | Attending: Pulmonary Disease

## 2023-04-02 VITALS — BP 124/79 | HR 65

## 2023-04-02 DIAGNOSIS — U099 Post covid-19 condition, unspecified: Secondary | ICD-10-CM | POA: Insufficient documentation

## 2023-04-02 DIAGNOSIS — R0609 Other forms of dyspnea: Secondary | ICD-10-CM | POA: Insufficient documentation

## 2023-04-02 NOTE — Interdisciplinary (Signed)
 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     O: Vitals are within patient's normal limits.    Exercise Today:  Resting Vitals  Resting Vital Signs  Blood pressure (BP): 124/79  BP Location: Left arm  Heart Rate: 65  SpO2: 98 %  Measurement Type : Finger  Oxygen: Room Air         Lower body Endurance Training   Recumbent Elliptical: Yes  Resistance Level : 1  Duration in Minutes : 8  Breathlessness: 3  Muscle Fatigue  : 5  Pain scale : 0  Heart Rate : 109  SpO2: 99  O2 Prescribed: No  Measurement Type : Finger  Change: Decreased from  Decreased from comment:: Time constraints    Strength Training   Strength Therapy : Yes - Upper Body;Yes - Lower Body  UB Free Weight #'s : 3 (No weight on left arm)  UB Reps: 8  UB Sets: 1  Change: Increased from  Increased from comment:: 2 lbs  LB Free Weight #'s : 3  LB Reps: 8  LB Sets: 1  LB Seated side Steps : Yes  LB Seated side Steps Reps: 8  LB Seated side Steps Sets: 1  Change: Increased from  Increased from comment:: 2 lbs         Supervised Exercise Session Time: 30 Minutes    Summary:  Patient's session consisted of education, strength, and endurance training.  Please refer above synopsis of today's exercise session.    Oxygen was continuously monitored throughout exercise session to maintain saturations greater than/equal to 90% on room air.    Plan:  Increase upper/lower body strength training with 3 upper & 3 lower lbs. at 1 set(s) of 10 reps.    Increase lower body endurance with Recumbent Cross trainer Elliptical  at 1 resistance for 15 minutes or to symptoms report 2-3 Slight to Moderate PB/PF.  Make therapeutic changes to patient's exercise prescription per symptoms at the time of session.    Chaneta Cydney Dome, RCP

## 2023-04-09 ENCOUNTER — Ambulatory Visit: Payer: Medicaid Other | Attending: Pulmonary Disease

## 2023-04-09 VITALS — BP 102/68 | HR 87 | Wt 162.0 lb

## 2023-04-09 DIAGNOSIS — U099 Post covid-19 condition, unspecified: Secondary | ICD-10-CM | POA: Insufficient documentation

## 2023-04-09 DIAGNOSIS — R0609 Other forms of dyspnea: Secondary | ICD-10-CM | POA: Insufficient documentation

## 2023-04-09 NOTE — Interdisciplinary (Signed)
 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 He communicated no changes in medications since previous session.   O: Patient's vital signs were within his normal range.     Initial and Current outcomes:  SOBQ: 52; Score: 33  CAT: 22; CAT Total Score : 15       Dyspnea Discharge:   Dyspnea Reassessment  Dyspnea Initial Assessment Date: 12/12/22  Uses Breathing Techniques: Purse Lip breathing;Box Breathing;ADLs  PLB Scale: Independently  BB Scale: Independently  ADL Breathing Technique: Pt describes improved tolerance of ADL's  Dyspnea Reassessment Comments : Patient stated he applies pursed lip breathing with exertion and practices box breathing daily. Communicated able to contribute to household ADLs such as vacuuming.    Oxygen Discharge:  Oxygen Reassessment  Oxygen Reassessment Evaluation : No prescribed supplemental oxygen    Exercise Rehab Discharge:  Exercise Reassessment in Rehab  Frequency: once a week  Endurance Training: Lower body  Endurance Mode: Recumbent Crosstrainer Elliptical  Recumbent Initial resistance and duration: #1/ 10 minutes  Recumbent current resistance and duration: #1/ 25 minutes  Strength Training: Upper body;Lower body  Initial Upper body sets, reps & load: 1 set, 8 reps, 2 lbs  Current upper body sets, reps & load: 1 set, 8 reps, 3 lbs  Initial lower body sets, reps & load: 1 set, 8 reps, 2 lbs  Current lower body sets, reps & load: 1  set, 8 reps, # lbs  O2 Sat Level: Monitor & Maintain O2 Sat at 90% or >  Prescribed supplemental oxygen with exertion: No  SpO2: 100 %  Measurement Type : Forehead  Oxygen: Room Air           Exercise Home Discharge:  Exercise Reassessment in Home  Comments: Patient stated he completes strength training twice a week with physical therapy. Communicated completing 2 sets of 10 reps with 1 lb free weights with PT. Due to poor weather, patient unable to walk outside, walking only with ADLs. Stated he will resume walking for lower body endurance when the weather improves.    Psychosocial Discharge:  Psychosocial Reassessment  Identifies coping strategies and stress management techniques: Progressing  Improved management of depression, anxiety, and/or stress related quality of life : Progressing  PHQ9 Patient Initial Summary Score : 11  Psychosocial Reassessment Comments : Patient stated he sees his therapist for talk therapy twice a week. He endorses adherence to psychosocial medication prescribed by psychiatrist. Communicated his anxiety and stress levels are high due to politics. Patient declined notifying PCP of elevated PHQ-9 due to established care with psychiatrist. Patient denies thoughts and denies plans of self-harm.  Thoughts that you would be better off dead, or of hurting yourself in some way: (!) Several days  PHQ9 Patient Summary Score (calculated): 10       OTHER CORE COMPONENTS:  Nutrition Discharge:  Verbalizes understanding of impact of weight and nutrition on overall disease prognosis, physical activity, and mobility: Met  Has  identified goals for attaining healthy body weight: Not indicated  Has Identified methods to improve ease of food preparation/shopping: Met  Has understanding of disease specific nutritional guidelines: Met  Has understanding of food intake and hydration guidelines for Pre and Post workout: Met     Nutrition Reassessment Comments: Patient communicated good appetite, consumes 2  meals prepared by his partner and snacks throughout the day, and endorses adequate hydration to help alleviate his headaches.  Rate Your Plate Initial: 50  Rate My Plate Discharge: 52  Initial Height: 5' 8 (1.727 m)   & WT 155 lbs  Current Height: 5' 8 (1.727 m)   & Weight: 73.5 kg (162 lb)      Cough Discharge:  Cough/Bronchial Hygiene Reassessment  Patient demonstrates effective cough: Yes  Secretion management: No Change  Cough Reassessment Comments : Patient continues to have a daily productive cough and able to expectorate thick yellow mucus.    Smoking Discharge:  Smoking Reassessment  Smoking Status: Quit Smoking    Sleep Quality Discharge:  Sleep Quality Reassessment  Sleep Quality: Poor  Has understanding of importance with equipment use: Other: (See comments)  Has understanding of primary sleep disturbance: Progressing  Has understanding of importance on sleep hygiene: Progressing  Sleep Study Obtained: Yes  Diagnosis of OSA: New diagnosis of OSA  OSA (obstructive sleep apnea) : Yes  Reported use : Identified Barriers to use  Sleep Quality Reassessment Comment: Patient stated he was diagnosed with sleep apnea, but is waiting for an appointment with pulmonary sleep before recieving a cpap machine. He estimates sleeping 3-4 hours each night.    Inhaled Medications Use Discharge:  Inhaled Medication Use Reassessment  Has understanding of correct time and frequency: Met  Has understanding of correct technique: Met  Adheres to prescription: Met  Demonstrates proper cleaning and care: Met  Medication Changes: Yes  What Changes?: Patient is newly prescribed Symbicort .    Self-Care Management Discharge:  Self Care Management Reassessment  Has understanding of respiratory infection prevention management: Met  Respiratory Infection Exacerbation Risk Comment: Patient communicated understanding of early symptom reporting and monitoring. He denies experiencing respiratory infections/exacerbations since start of  program.  Last date of unplanned hospital readmission/ER visit due to a pulmonary exacerbation: n/a  Recent exacerbation: No  Travel Education  Travel Education comment:: Patient communicated no plans to travel at this time.    Exercise Today  Resting Vitals:  Resting Vital Signs  Blood pressure (BP): 102/68  BP Location: Right arm  Heart Rate: 87  SpO2: 100 %  Measurement Type : Forehead  Oxygen: Room Air    Lower body Endurance Training   Comment:: Patient declined endurance training due to fatigue and muscle soreness from physical therapy today.     Strength Training   Strength Therapy : No  Comments: Patient declined strength training due to fatigue and muscle soreness from physical therapy today.    Flexibility/Stretching   Flexibility/Stretching : Not Performed    6 MINUTE WALK TEST  6 MWT  Purpose for walk: Discharge  Pre 6 MWT:  Resting Vitals:  Blood pressure (BP): 102/68  BP Location: Right arm  Heart Rate: 87  SpO2: 100 %  Measurement Type : Forehead  Oxygen: Room Air    Rests  Rest Duration (in Seconds) : 0    Observation  Pursed Lips Breathing : Yes  Accessory Muscle Use : No  Shoulders Elevated : No  Balance: Adequate  Gait: Steady  Assistive Devices: No  Post 6 MWT  Summary  Total Distance (m): 310  MPH: 1.92  SPO2: 99  Heart Rate : 103  Patient Rated Breathlessness: 3  Patient Rated Muscle Fatigue: 1  Patient Rated pain: 3  Pain Location : mid back  Sit to Stand 5 times: 16.10 seconds  Observations and Recommendations : Patient communicated chest tightness rated 3.5 out of 10.    Supervised Exercise Session Time: 42 Minutes    DISCHARGE SUMMARY:  Gordon Huang is a 33 year old year old male, referred to [facility] by Cesario Jon Pyles of [place] for monitored exercise and education.   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      Patient's Chief Complaint: Shortness of breath, fatigue, and trouble focusing    Gordon Huang completed 13 sessions of pulmonary  rehabilitation while meeting their goal of increased strength and endurance.    Patient has demonstrated a good use of PLB, pacing, and posture.    Today's session consisted of post 6 MWT and collection of post outcome measures. Please refer to Outcomes and 6 MWT sections for a summary.   Patient showed an increase in their 6 minute walk test with a change of 17 meters. Pre PR 6 MWT = 293 meters. Post PR = 310 meters.    Patient showed an increase in their Sit to Stand with a change of 3.52 secs. Pre PR Sit to Stand = 12.58 secs Post PR Sit to Stand = 16.10 secs  Patient has progressed with his rehab exercise modalities since start of care. Please refer to Exercise D/C section for a summary.      Oxygen was continuously monitored throughout exercise session to maintain saturations greater than/equal to 90% on room air.      [place], [place]

## 2023-05-15 ENCOUNTER — Encounter (INDEPENDENT_AMBULATORY_CARE_PROVIDER_SITE_OTHER): Payer: Self-pay | Admitting: Rehabilitative and Restorative Service Providers"

## 2023-05-15 ENCOUNTER — Ambulatory Visit (INDEPENDENT_AMBULATORY_CARE_PROVIDER_SITE_OTHER): Payer: Medicaid Other | Admitting: Rehabilitative and Restorative Service Providers"

## 2023-05-15 DIAGNOSIS — M25312 Other instability, left shoulder: Secondary | ICD-10-CM

## 2023-05-15 NOTE — Patient Instructions (Signed)
 The patient is now 6 months postop.    Okay to start progressive strengthening at the left shoulder joint.  Focus on scapular stabilizers and rotator cuff strength.  When appropriate okay to start closed chain posture such as table top and progress eventually to plank.  This progression should be slow as the patient has considerable weakness in his left shoulder that is still needs to be addressed.  By the time the patient comes back for his next appointment I would like him to have near equal full range of motion bilaterally.      Please call the office at [phone] with any questions or concerns.

## 2023-05-15 NOTE — Progress Notes (Signed)
 [place] [facility]PART[facility] OF ORTHOPAEDIC SURGERY, SPORTS MEDICINE    PRIMARY CARE PROVIDER:   Dy, Diane Jazmin    Procedure:   Left shoulder arthroscopy, anterior labral repair, capsulorrhaphy,  remplissage that is repair of infraspinatus tendon into Hill-Sachs lesion.     DOS:   November 19, 2022    HISTORY: Gordon Huang is a 33 year old male who presents to clinic today for evaluation of left shoulder pain.  He is now 6 months postop.  When he was last seen in clinic there was concerns regarding his overall range of motion.  He has been working aggressively on this in physical therapy and notes improvements.  They are doing some light strength work.  Overall he states that his left shoulder is doing well.  He still notes discomfort in the left shoulder some nights when sleeping as well as with weather change.    Past medical history, medications, allergies are unchanged.    PHYSICAL EXAM:  VITALS: There were no vitals taken for this visit.  GENERAL: A+Ox3. INAD. Well appearing and well groomed. Normal gait  MENTAL STATUS: Pleasant and cooperative. Alert and oriented x3 with normal mood and affect.  Vascular: Pulses are palpable 2+, capillary refills to digits are normal.  Skin: No wounds/lesions/ulcers. Skin warm & dry.  Respiratory: No respiratory distress    MUSCULOSKELETAL:    Shoulder: Left  Normal posture.  Shoulder: No swelling.  Incisions are healed  Range of Motion:  Forward flexion 130.  Contralateral side 160.  External rotation 30.  Contralateral side 45.  Internal rotation sacrum.  Contralateral side L3.  Strength is 5-/5 in the supraspinatus, infraspinatus, subscapularis.  Sensation intact to light touch in the bilateral upper extremities.  2+ radial pulses.    ==========    ASSESSMENT & PLAN: Gordon Huang is a 32 year old male 6 months status post left shoulder arthroscopy, anterior labral repair, capsulorrhaphy, remplissage.  He is doing well.  He was made appropriate gains in range of motion  but I would like him to work on overall equal range of motion bilaterally by his next clinic appointment.  In physical therapy since he has made gains with range of motion I would now like them to address his overall left shoulder strength.  He will focus on periscapular strengthening and rotator cuff strengthening.  He will follow up with me in 2 months.  All other questions and concerns were addressed.     Patient verbalizes understanding of all discussions today, and all questions were answered satisfactorily.

## 2023-06-23 ENCOUNTER — Telehealth (INDEPENDENT_AMBULATORY_CARE_PROVIDER_SITE_OTHER): Payer: Self-pay | Admitting: Internal Medicine

## 2023-06-23 NOTE — Telephone Encounter (Signed)
 LVM authorization has not been obtained for patients [facility] appointment scheduled for 5/27. Patient may keep appointments and sign LOA at time of check in.

## 2023-06-24 ENCOUNTER — Ambulatory Visit (INDEPENDENT_AMBULATORY_CARE_PROVIDER_SITE_OTHER): Payer: Medicaid Other | Admitting: Pulmonary Disease

## 2023-06-24 ENCOUNTER — Ambulatory Visit (INDEPENDENT_AMBULATORY_CARE_PROVIDER_SITE_OTHER): Payer: Medicaid Other | Admitting: Internal Medicine

## 2023-06-24 ENCOUNTER — Ambulatory Visit (INDEPENDENT_AMBULATORY_CARE_PROVIDER_SITE_OTHER): Payer: Medicaid Other | Admitting: Family Practice

## 2023-06-24 VITALS — BP 117/70 | HR 85 | Temp 96.5°F | Ht 68.0 in | Wt 170.0 lb

## 2023-06-24 DIAGNOSIS — R41840 Attention and concentration deficit: Secondary | ICD-10-CM

## 2023-06-24 DIAGNOSIS — F321 Major depressive disorder, single episode, moderate: Secondary | ICD-10-CM

## 2023-06-24 DIAGNOSIS — G44001 Cluster headache syndrome, unspecified, intractable: Secondary | ICD-10-CM

## 2023-06-24 DIAGNOSIS — Z7409 Other reduced mobility: Secondary | ICD-10-CM

## 2023-06-24 DIAGNOSIS — U099 Post covid-19 condition, unspecified: Secondary | ICD-10-CM

## 2023-06-24 DIAGNOSIS — L739 Follicular disorder, unspecified: Secondary | ICD-10-CM

## 2023-06-24 DIAGNOSIS — G9332 Myalgic encephalomyelitis/chronic fatigue syndrome: Secondary | ICD-10-CM

## 2023-06-24 DIAGNOSIS — J45998 Other asthma: Secondary | ICD-10-CM

## 2023-06-24 DIAGNOSIS — R0609 Other forms of dyspnea: Secondary | ICD-10-CM

## 2023-06-24 MED ORDER — TIOTROPIUM BROMIDE MONOHYDRATE 1.25 MCG/ACT IN AERS
2.0000 | INHALATION_SPRAY | Freq: Every day | RESPIRATORY_TRACT | 11 refills | Status: AC
Start: 2023-06-24 — End: ?

## 2023-06-24 MED ORDER — BUDESONIDE-FORMOTEROL FUMARATE 160-4.5 MCG/ACT IN AERO
INHALATION_SPRAY | RESPIRATORY_TRACT | 4 refills | Status: AC
Start: 2023-06-24 — End: ?

## 2023-06-24 MED ORDER — BUDESONIDE-FORMOTEROL FUMARATE 160-4.5 MCG/ACT IN AERO
INHALATION_SPRAY | RESPIRATORY_TRACT | 4 refills | Status: DC
Start: 2023-06-24 — End: 2023-06-24

## 2023-06-24 MED ORDER — TIOTROPIUM BROMIDE MONOHYDRATE 1.25 MCG/ACT IN AERS
2.0000 | INHALATION_SPRAY | Freq: Every day | RESPIRATORY_TRACT | 11 refills | Status: DC
Start: 2023-06-24 — End: 2023-06-24

## 2023-06-24 MED ORDER — DOXYCYCLINE HYCLATE 100 MG OR TABS: 100.0000 mg | ORAL_TABLET | Freq: Two times a day (BID) | ORAL | 0 refills | Status: DC

## 2023-06-24 NOTE — Progress Notes (Signed)
 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 COVID 08/2018, end of 2021, 11/2021 (2 other infections, 1st in AUG 2020--mild, like a bad cold, sick for a week and a half, then again end of 2021, sick for almost a month--fatigue, SOB, no taste or smell). c/b sig fatigue, PEM, neurocog sx (memory issues, confusion, mental acuity), decrease endurance, exercise intol. Mild OSA. Doing pulm rehab but some PEM. Likes going to pulm rehab. Hasn't been able to work. Not getting emotional support from dad. Applying for in home care. Feeling down. Hypersomnolence and worsening depression/anxiety. Sees therapist 2x/wk. S/p surgery for prior broken shoulder and fall exacerbating his sx. Occ dry rash/bumps at fingers. No oral ulcers/alopecia. +occ dry eyes but no dry eyes. +SOB. Chest tightness. ?mild RP w/ white/red feelings. Needs circulating air to sleep at night. No photosensitivity. Some joint pain at R>L knees. Right arm feels tired, been using it more after left shoulder surgery. Feels like he has to crack his joints all the time (knees/neck). +CLBP ever since breaking it 2015 (L2-4 compression fracture) during depression/situational things. 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 Still has brain fog and fatigue. 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 Online and self-pacing. C/o cold/clammy hands and feeling cold. +RP. Has nerve pain on right 2nd/4th hand and top of left foot associated with some muscle spasm. Also intermittent red spots on face under the eye and thighs. Some numbness/tingling right hand, happening more frequently, lasting up to a day. Pt wondering about EDS. Stretchy skin and super flexible all his life. Able to fall asleep midnight. Difficulty staying asleep. Can wake up anytime between 6a/noon/3am. Unrefreshing sleep. Psych wants to hold  off on TMS. Wants to wait until neuropsych testing completed. Daily headache x 1 yr. Oral ulcers, sicca. Frequent infections as a child w/ sinus infections; slowed down early 20s. No recurrent GERD. No alopecia. No DVT/PE    ROS: 14-point ROS was negative other than as noted above.    PMH:  Left shoulder surgery/arthroscopy 10/2022 for broken shoulder 2009  Bipolar  Anxiety  Depression w/ h/o SI  Acute dystonic reaction due to drugs 05/2022  SVT s/p ablation 2016  ADHD  Mono x 3  GERD/Barrett's esophagus s/p EGD 2015; s/p tx    Allergies:   Allergies   Allergen Reactions    Beef Extract Other and Itching     All Red-Meat Products    Nsaids Itching       Medications:   H/o LDN 4mg  04/2022, felt better but was off for shoulder surgery 10/2022; helps w/ brain fog; restarted last week  H/o cymbalta -> sig wt gain and anger  H/o s-ketamine prior to surgery but had to stop     coQ10  H/o Mushroom: helped with brain fog/energy but too expensive $60 and stoped  LDN 4mg  qdaily (this dose 2.2025, restarted 12/2022)  Lamictal   Truvada  Wellbutrin   Naprosyn   Methylphenidate /concerta     Outpatient Medications Marked as Taking for the 06/24/23 encounter (Office Visit) with Jama Devere Ochs, MD   Medication Sig Dispense Refill    (TRUVADA) emtricitabine  200 mg-tenofovir  disoproxil 300 mg tablet Take 1 tablet by mouth daily.      budesonide -formoterol  (SYMBICORT ) 160-4.5 MCG/ACT inhaler SYMBICORT  (BUDESONIDE -FORMOTEROL ) 2 puffs twice a day and in addition, 1-2  puffs up to every 6 hrs as needed for cough, wheezing, chest tightness or shortness of breath. Not to exceed 12 puffs daily. 10 g 4    buPROPion  (WELLBUTRIN  XL) 300 MG XL tablet Take 1 tablet (300 mg) by mouth daily.      lamoTRIgine  (LAMICTAL ) 150 MG tablet Take 1 tablet (150 mg) by mouth 2 times daily.      LORazepam  (ATIVAN ) 1 MG tablet Take 1 tablet (1 mg) by mouth every 4 hours as needed for Anxiety.      Methylphenidate  HCl ER, OSM, 72 MG TBCR Take 54 mg by mouth daily.       naloxone  (NARCAN ) 4 mg/0.1 mL nasal spray FOR SUSPECTED OVERDOSE, call 911! Tilt head, spray one spray into one nostril as needed for respiratory depression. If patient does not respond or responds and then relapses, repeat using a new nasal spray TO THE OTHER NOSTRIL every 3 minutes until emergency medical assistance arrives. 2 each 0    naltrexone  (DEPADE) 50 MG tablet Take 1 tablet (50 mg) by mouth. Compound into 50mL of water and 50mg  = 4ml      Spacer/Aero-Holding Chambers (AEROCHAMBER PLUS-FLOW SIGNAL) MISC Use as directed with inhaler 1 each 1       FHx: No known autoimmune disease. TTP-dad  family history includes Asthma in his father; COPD in his father; Other in his father.    SocHx:    reports that he quit smoking about 8 years ago. His smoking use included cigarettes. He started smoking about 19 years ago. He has a 5.5 pack-year smoking history. He has been exposed to tobacco smoke. He has quit using smokeless tobacco.    Physical Exam:   Vital Signs:   BP 117/70   Pulse 85   Temp 96.5 F (35.8 C) (Temporal)   Ht 5' 8 (1.727 m)   Wt 77.1 kg (170 lb)   SpO2 98%   BMI 25.85 kg/m     GEN: NAD. Left arm in a sling  CV: RRR, No M/R/G.  Chest: CTAB.  Abd: S/NT/ND.  Ext: No C/C/E.   MSK: No active synovitis. +tinel's on right hand  Skin: No rash.       Labs: [] reviewed [] ordered.  Lab Results   Component Value Date    WBC 4.7 01/12/2023    HGB 16.1 01/12/2023    HCT 45.3 01/12/2023    PLT 313 01/12/2023     No results found for: GLUCOSE, CREAT, CALCIUM, TOTPRO, ALBUMIN, AST, ALT, ALKPHOS  No components found for: PROTCLUR, BLDUR, LEUKESTUR, RBCSUR, WBCSUR  No results found for: CRP  No results found for: C3, C4, DRVVT, SMAB, SSA, CANCA, PANCA, MPOAB      Studies: [] reviewed [] ordered [] independently reviewed.  01/2022 MRI brain neg     01/12/23 nl tsh/ft4, vit B12 655, cortisol 5.3 at 3p, wbc 4.7 h/h 16/45 plt 313      A/P:  A) DEPRESSION/ANXIETY/BIPOLAR,  PASC (brain fog, PEM, fatigue, arthralgia), HYPERMOBILITY: pt w/ 3 acute COVID infections c/b constellation of sx all c/w PASC. Was getting some improvement with LDN when he had to stop for shoulder surgery and opioid use. On LDN currently. Despite arthralgia, no clear e/o underlying inflammatory arthritis or CTD. Cont to have sleep disturbances w/ brain fog and fatigue. Some improvement with mushroom supplement but unable to afford it. Now having daily headache and some neuropathic sx. Has had frequent infections including sinus infections and could consider CVID. No other manifestations of AAV  Large numbers of patients who have been infected with SARS-CoV-2 continue to experience a constellation of symptoms long past the time that they've recovered from the initial stages of COVID-19 illness. Often referred to as "Long COVID", these symptoms, which can include fatigue, shortness of breath, "brain fog", sleep disorders, fevers, gastrointestinal symptoms, anxiety, and depression, can persist for months and can range from mild to incapacitating. In some cases, new symptoms arise well after the time of infection or evolve over time.   While still being defined, these effects can be collectively referred to as Post-Acute Sequelae of SARS-CoV-2 infection (PASC).  Treatments for PASC are limited, and focus on symptom management and comprehensive physical and respiratory rehab.    -trial of Mg threonate for headache  -trial of vit B12 SL 2500mcg daily  -refer to neurology Dr. Alto for PASC/headache  -check ANA/SLE serologies given sicca sx, ANCA/MPO/PR3, IgG/A/M, as well morning cortisol  -ask psych re: safety of gabapentin for neuropathy  -asking about genetic testing for EDS; referred to EDS foundation  -cont LDN  -pt wants to hold off on TMS for both depression and PASC but would like to meet w/ them to discuss risks/benefits  -interested in peer support group  -Interested in Long COVID registry  -pt already failed  cymbalta due to AEs; would not rec elavil given multiple other psych meds and underlying psych history  -d/w'ed Drs. Wang/Slater    40 min spent on patient care/record review.      Devere JINNY Ruth, M.D.

## 2023-06-24 NOTE — Telephone Encounter (Signed)
 Patient arrived to office and signed LOA for all 3 visits. Patient aware that the auth team has been contacted and informed to work on obtaining authorizations.

## 2023-06-24 NOTE — Patient Instructions (Signed)
 Continue symbicort  2 puffs twice daily and in  addition, 1-2 puffs every 6 hrs as needed for chest tightness or shortness of breath. NOT TO EXCEED 12 PUFFS DAILY  START TIOTROPIUM (SPIRIVA ) 2 PUFFS EVERY MORNING  3. GET LABS DONE

## 2023-06-24 NOTE — Patient Instructions (Addendum)
 Try magnesium threonate for headache  Sublingual vitamin B12 2500mcg daily x 1-2 months  Follow with 2 referrals: Dr. Nolon (psych-TMS) and Dr. Alto (neurologist)  Look [facility] On line for support resources  Cortisol before 10am.  Wrist brace at night  Ask your psychiatrist about the safety of using gabapentin 100-300mg  at night for carpal tunnel syndrome

## 2023-06-24 NOTE — Telephone Encounter (Signed)
 Patient spoke with his insurance and was informed they did not receive the request for a follow up appt. He states they advised the clinic send an URGENT fax so they may review for authorization to the fax number below. Patient states he will be there for his appt at 3pm.      Fax [phone]    [facility]. Ph. [phone]

## 2023-06-24 NOTE — Patient Instructions (Addendum)
 [name],    You are slowly improving.    Continue your healthy lifestyle and   Take 7 days of Doxycycline  antibiotic twice a day for the scalp folliculitis infection.  If not better, see your primary care physician.    Follow up with us  in the Long Covid Clinic at [place] in 6 months     Thank you.      - Dr. Leatrice

## 2023-06-24 NOTE — Progress Notes (Signed)
 [facility] PULMONARY CLINIC NOTE      -    Demographics:   Medical Record #: [id number]   Date: Jun 24, 2023   Patient Name: Gordon Huang   DOB: 07-03-1990  Age: 33 year old  Sex: male  Location: clinic      I personally spent total 45 minutes in face-to-face and non-face-to-face activities  related to the patient's visit today, excluding any separately reportable  services/procedures      ASSESSMENT AND PLAN  Gordon Huang is a 33 year old male  with fatigue, decreased endurance and exercise tolerance associated with post exertional malaise as well as cognitive dysfunction and problems with concentration and attention, confusion, forgetfulness, difficulty understanding what others are saying, reduced mental acuity, and mental fatigue     At his last visit, we reviewed results of recent HST which demonstrated mild OSA  He has witnessed apnea and It is possible that HST underestimates severity of OSA  We ordered APAP 5 -15 cm H2O  Sleep referral placed.    He has not received equipment nor been scheduled for a consultation  We have followed up with Sleep clinic    He continues to have symptoms suggestive of reactive airways  We recommend continuing symbicort   and adding spiriva  1.25  Labs ordered to assess T2 phenotype    Mobility and endurance improved after pulmonary rehab          The plan was carefully reviewed verbally with the patient and also affirmed that the patient understood next steps and follow up plan.    He has been referred to Gen Pulm for RV/TOC to follow up on test results and response to therapy    I am seeing this patient for ongoing, long-term evaluation and management of their Long COVID syndrome.  This is a chronic complex medical condition which requires longitudinal evaluation and/or management.               HPI:   Gordon Huang is a 33 year old male   Evaluated in [facility] in DEC 2024    Aat that time,  profound fatigue, decreased endurance and exercise tolerance associated with  post exertional malaise as well as cognitive dysfunction and problems with concentration and attention, confusion, forgetfulness, difficulty understanding what others are saying, reduced mental acuity, and mental fatigue      Has had mutliple sleep tests  Previously found to have mild OSA which is seen on more recent HST  Fatigue and sleepiness appear out of proportion to mild OSA seen on current testing.  Will start APAP 5 -15 cm H2O  Sleep referral placed.     lab workup for post covid fatigue ordered  Echocardiogram is pending     Decreased exercise and endurance may in part be related to not well controlled asthma  No evidence of resp muscle weakness on exam but he yawns and sighs frequently  Will start SYMBICORT  (BUDESONIDE -FORMOTEROL ) 2 puffs BID and in addition, 1-2 puffs up to every 6 hrs as needed for cough, wheezing, chest tightness or shortness of breath. Not to exceed 12 puffs daily.         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)    Post-COVID chronic dyspnea    Post-COVID chronic fatigue    Post-COVID chronic concentration deficit    Post-COVID chronic decreased mobility and endurance       Medications     Psychosocial:  none     Socioeconomic History    Marital status: Divorced   Tobacco Use    Smoking status: Former     Average packs/day: 0.5 packs/day for 11.0 years (5.5 ttl pk-yrs)     Types: Cigarettes     Start date: 2006     Quit  date: 2017     Years since quitting: 8.4     Passive exposure: Past    Smokeless tobacco: Former    Tobacco comments:     Mother smoked   Vaping Use    Vaping status: Former    Substances: Nicotine, THC   Social History Narrative    Born and raised:[place], moved to [place] end of 2019    Pets:6 cats in his household    Coffee:1 cup daily    Carbonated beverages:less than once a week, special events only        Dinner: lunch is around 5-6, 11 at night    Azi:jmnlwi 2 am    Snacks?:something small so that he can take his medication          Physical Exam:    GENERAL: alert, no distress  Breathing appears slightly tight  Sighs intermittently throughout exam  MENTAL STATUS: Full and Appropriate  HEENT: EOMI, Sclera clear, anicteric, and Neck supple with midline trachea  CV: RRR distant S1S2, no murmurs, rubs or gallops  CHEST PHYSICAL FINDINGS: BS clear to auscultation  ABDOMEN INSPECTION:   EXTREMITY LOCATION:  without edema  BEHAVIORAL:   NEUROLOGICAL: Moves all four extremities spontaneously. Speech normal.  SKIN: without clubbing cyanosis or rash    LABS  Lab Results   Component Value Date    WBC 4.7 01/12/2023    HGB 16.1 01/12/2023    HCT 45.3 01/12/2023    PLT 313 01/12/2023    EOS 1.3 01/12/2023       No results found for: BUN, CREAT, NA, K, CL, BICARB, AST, ALT, ALK    Lab Results   Component Value Date    ABSEOS 0.1 01/12/2023     No results found for: CO19@  No results found for: CRP, ESR, IL6SERUM        STUDIES   I have personally reviewed and analyzed labs, PFTs and imaging studies  Unless otherwise stated, I agree with the official interpretation      PULMONARY FUNCTION TEST      NOV 2024 HOME SLEEP TEST        Orders this encounter:  Lab No orders of the defined types were placed in this encounter.    Imaging No orders of the defined types were placed in this encounter.    Procedures No orders of the defined types were placed in this encounter.    Other No orders of the defined types  were placed in this encounter.

## 2023-06-24 NOTE — Progress Notes (Signed)
 [facility] Visit     Return Integrative Consultation     Referred by: Gordon Toribio Ade, MD  [address]  [address]  [place],  [place] [address]   Primary care physician:   Gordon Huang     SUBJECTIVE:  Gordon Huang is a 33 year old male who presents for an integrative visit, for help with Long Covid Follow Up       Patient's most important issues for today's visit:  And he now seeks feedback regarding his symptoms and conditions detailed below, and guidance regarding integrative, complementary, alternative, and natural methods he can also employ to help improve his health and reduce the risk of relapse after treatment, if that should be successful. he seeks feedback regarding his conventional treatment options as well.       Pre-Visit Chart Review:    Conducted, including, but not limited to:  Long Covid January visit  Available specialist visits Care Everywhere      ==========================    HPI Today:    1.  Long Covid brain fog    Has tried prescription LDN (low dose naltrexone ):  Most helpful with his brain fog, e.g. significant difficulty reading things* for more than 2 minutes, has only a small improvement at a dose up to 4mg , then he  Had to go off the naltrexone  for Orthopedic shoulder surgery & recovery while on opiates - with [name], sees Gordon Huang now  Not much change while off the LDN  Is now quickly (every other week 0.5mg ) back up to 4mg  and finds it is helping again  Dilutes a 50mg  tablet in 50mL water (bitter) with a syringe (along with eating a sweet thing right after    *Limited to reading difficulty:  (Can listen to audiobooks & music just fine vs. Movies, TV)    2.  Fatigue secondary to Long Covid:  After a month and a half stopped taking 2 each of Cordyceps/ Mycobrain pills due to cost  Within 2 weeks   A little more energy went from average 5-6/10 to 7-8/10       and  Stuttering over words much less (15% better)  Most frustrating thing  he deals with and he never had before (always talked fast)    3. Skin concerns:  About 4 months of one or two itchy spots on scalp  Is was also previously on thighs, comes  goes, dark red  No better with antibacterial soap or other creams    Folliculitis - minor     4.  Past medical history   Childhood: usually got much sicker than others in family and social network. Often we prescribed antibiotics   Had mononucleosis 3x    Family history biological brother also gets sick a lot just like him      My initial consultation visit 01/12/23  HPI:  Dr. Sheilah Huang at [facility] prescribes his Naltrexone  - he refers to her as his Covid doctor     His primary symptoms of concern today are:  1.  Fatigue & hypersomnolence     2.  Shortness of breath always     3.  Irritable mood 2/2 to fatigue  ADHD     4.  Brain Fog - memory and also  Naltrexone  helped this but not fatigue or mood   Resumed 0.5 milligram Naltrexone      Told not to go over 4 milligrams/day by Dr. Burnet  ADHD and memory are worse too, LDN didn't help     5.  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 Max 1-2/month, most months none  Water: lots   Eating Habits: sporadic, no clear schedule like breakfast, lunch, and dinner      Exercise: limited to Pulm Rehab     Sleep: as above       Supplements: (in addition to prescribed medications)   All from [employer]:  Multivitamin One a day Men's  Magnesium unsure  if 400mg  Nature Made  Vitamin D3  (? 1,000 international units Unsure of dose) green bottle     ASSESSMENT & PLAN:       Encounter Diagnoses   Name Primary?     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        Consider other diagnoses and  Other contributing conditions, including, but not limited to toxins  Could benefit from   Better diet  Total avoidance of alcohol,  OMT and   from Acupuncture     Also he Could benefit from  CoQ-10 (Coenzyme Q-10/ ubiquinol) and  Cordycpeps  Better PACING & Sleep         Orders Placed This Encounter   Procedures    Lead, Whole Blood Royal Blue-EDTA    Mercury, Blood Royal Blue    Vitamin B12, Blood Green Plasma Separator Tube    Osteopathic Manual Medicine Clinic    Acupuncture Clinic      3-6 months in [facility]     Patient Instructions   Gordon Huang,     It was a pleasure working with you     The supplements that I mentioned in our visit are listed below     Consider trying each of them all at once, or individually if you prefer.  If you try one at a time, begin with CoQ-10 (Coenzyme Q-10 - ubiquinol) for a week,      Followed by Cordyceps in addition to the above for the 2nd week,      then in the 3rd week, continue both the above, and add the  MycoBotanicals Brain.     Take a dose of 200mg  or 300mg  or 400mg  every AM of CoQ10.     Brands of CoQ-10 (Coenzyme Q-10/ ubiquinol) I recommend are:     Dorothey Kelp  NOW   [employer]  Protocol for Life Balance     [employer] sells these online and in several natural stores around town:     MetLife - take 2 capsules before noon     MycoBrain - two capsules a day, either together or divided.     www.hostdefense.com     Patient Instructions   Tips on long covid and fatigue from Dr. Leatrice  Maximize quality of sleep and  Quantity of sleep     Go to sleep earlier.     Pacing activity is especially important.     Pacing - American ME and CFS Society (ammes.org)      Staying within the "energy envelope," or pacing, is one way a  person with ME/CFS can help mitigate PEM. Avoiding activities which will place too great a strain on the body's ability to produce adequate ATP is a key strategy of pacing, but adequate rest is equally as important. What is important in pacing is to avoid crossing the energy threshold.  Knowing when you have crossed the energy threshold is tricky. In part, it depends on how ill a person is. A severely ill patient crosses the energy threshold daily simply by being ill. However, mildly and moderately ill patients may not even be aware when they have "done too much." For these patients, it is important to adjust activities to avoid the worst effects of PEM.  The website link above provides some important strategies to help patients pace.   = = = = =   Rest/pace:  A low mitochondria reserve has demonstrated in ME/CFS Cathy BROCKS et al, PLOS one 2017)  - Pre-emptive rest before engaging in physical or stimulatory activity can be helpful.  - Avoid energy crashes by limiting the actions that will exacerbate the symptoms of ME/CFS. Remember those events/crashes can occur days after overdoing physical or stimulation activities.  - Tracking your activities (daily steps, time spent, heart rate, use of electronics, etc.) by using monitoring tools such as your smartphone, smartwatch, etc. can be Very helpful to learn your limits     Avoid all alcohol     Include 5 different vegetables or fruits every day  Variety - 20 per week  Spices count!     CoQ10 could be a helpful supplement for you  200 or 300mg  daily while you are taking the statin  Select a good brand   Shop carefully online for supplements as we discussed     Check out the book the Long Covid Solution by Tobias Sprang MD     Thank you.    Remember, I will be very delayed in replying to messages.     GLENWOOD Toribio Nevin MD      06/24/23 Social History, Inclusive:  unchanged from above        Outpatient Medications Marked as Taking for the 06/24/23 encounter (Office Visit) with  Nevin Toribio Ade, MD   Medication Sig Dispense Refill    (TRUVADA) emtricitabine  200 mg-tenofovir  disoproxil 300 mg tablet Take 1 tablet by mouth daily.      [DISCONTINUED] budesonide -formoterol  (SYMBICORT ) 160-4.5 MCG/ACT inhaler SYMBICORT  (BUDESONIDE -FORMOTEROL ) 2 puffs twice a day and in addition, 1-2 puffs up to every 6 hrs as needed for cough, wheezing, chest tightness or shortness of breath. Not to exceed 12 puffs daily. 10 g 4    buPROPion  (WELLBUTRIN  XL) 300 MG XL tablet Take 1 tablet (300 mg) by mouth daily.      lamoTRIgine  (LAMICTAL ) 150 MG tablet Take 1 tablet (150 mg) by mouth 2 times daily.      LORazepam  (ATIVAN ) 1 MG tablet Take 1 tablet (1 mg) by mouth every 4 hours as needed for Anxiety.      Methylphenidate  HCl ER, OSM, 72 MG TBCR Take 54 mg by mouth daily.      naloxone  (NARCAN ) 4 mg/0.1 mL nasal spray FOR SUSPECTED OVERDOSE, call 911! Tilt  head, spray one spray into one nostril as needed for respiratory depression. If patient does not respond or responds and then relapses, repeat using a new nasal spray TO THE OTHER NOSTRIL every 3 minutes until emergency medical assistance arrives. 2 each 0    naltrexone  (DEPADE) 50 MG tablet Take 1 tablet (50 mg) by mouth. Compound into 50mL of water and 50mg  = 4ml      Spacer/Aero-Holding Chambers (AEROCHAMBER PLUS-FLOW SIGNAL) MISC Use as directed with inhaler 1 each 1        Allergies to medications:  Beef extract and Nsaids  Past Medical History:   Diagnosis Date    ADHD     Asthma early childhood, came back after COVID    Bipolar 1 disorder (CMS-HCC)     Chronic fatigue syndrome with fibromyalgia Covid 2023    Depression     Hyperlipidemia Late 2023, early 2024    Irritable bowel syndrome     Memory disorder COVID 2023    Personal history of self-harm     Suicide attempt (CMS-HCC)     Several times over the years - I am stable now     No past surgical history on file.  Family History   Problem Relation Name Age of Onset    Asthma Father Turon Kilmer      COPD Father Aldric Wenzler     Other Father Levander Font         TTP    Arthritis Father Ray Font     Clotting Disorder Father Levander Font         Thrombotic Thrombocytopenic Purpura (TTP)    Diabetes Father Levander Font     ADHD Mother Channing Pandy     Alcohol/Drug Mother Channing Pandy     Anxiety Mother Channing Pandy     Asthma Mother Channing Pandy     Bipolar Disorder Mother Channing Pandy     Depression Mother Channing Pandy     Heart Disease P Grandfather Ray Kessner Sr.     Neurological Disease P Grandfather Ray Digman Sr.      Social History     Socioeconomic History    Marital status: Divorced     Spouse name: Not on file    Number of children: Not on file    Years of education: Not on file    Highest education level: Not on file   Occupational History    Not on file   Tobacco Use    Smoking status: Former     Average packs/day: 0.5 packs/day for 11.0 years (5.5 ttl pk-yrs)     Types: Cigarettes     Start date: 2006     Quit date: 2017     Years since quitting: 8.5     Passive exposure: Past    Smokeless tobacco: Former    Tobacco comments:     Mother smoked   Vaping Use    Vaping status: Former    Substances: Nicotine, THC   Substance and Sexual Activity    Alcohol use: Yes     Alcohol/week: 2.0 standard drinks of alcohol     Types: 2 Glasses of wine per week    Drug use: Yes     Types: Marijuana    Sexual activity: Yes     Partners: Male   Other Topics Concern    Military Service No    Blood Transfusions No    Caffeine Concern No    Occupational Exposure No  Hobby Hazards No    Sleep Concern Yes    Stress Concern Yes    Weight Concern No    Special Diet Yes     Comment: Pescatarian    Back Care Yes    Exercises Regularly Yes    Bike Helmet Use Yes    Seat Belt Use Yes    Performs Self-Exams No   Social History Narrative    Born and raised:[place], moved to [place] end of 2019    Pets:6 cats in his household    Coffee:1 cup daily    Carbonated beverages:less than once a week, special events only        Dinner: lunch is  around 5-6, 11 at night    Azi:jmnlwi 2 am    Snacks?:something small so that he can take his medication     Social Drivers of Psychologist, prison and probation services Strain: Not on file   Food Insecurity: Unknown (01/29/2022)    Received from [facility], [employer] Insecurity     In the past 3 months, have you had to go without food for 24 hours, multiple times due to lack of resources?: Not on file   Transportation Needs: Unknown (01/29/2022)    Received from [facility], [facility]    Transportation Needs     In the past 3 months, has lack of transporation kept you from medical appointments or getting things you need that are essential to your health?: Not on file   Social Connections: Unknown (03/20/2022)    Received from [facility], [facility]    Social Connections     In the past 3 months, do you feel that you lack companionship or social support?: Not on file   Housing Stability: Not on file       I did review the available past medical, family, and social history from the electronic records.     OBJECTIVE:    BP 117/70   Pulse 85   Temp 96.5 F (35.8 C) (Temporal)   Ht 5' 8 (1.727 m)   Wt 77.1 kg (170 lb)   SpO2 98%   BMI 25.85 kg/m  Body mass index is 25.85 kg/m.  Well Developed, Well nourished male in No Acute Distress  Affect: normal, neither anxious nor depressed. Grooming: appropriate   Communication was clear, not at all difficult to understand / tangential / pressured / slowed speech   Head: normocephalic, atraumatic  Eyes: anicteric OU  Neuro: Oriented, alert, cooperative, Gait: normal  Skin: exposed areas free of rash and only significant lesions are three pinpoint pink/red fading papules on scalp at follicles        Pertinent Labs & Imaging, other studies / procedures: reviewed      ASSESSMENT & PLAN:  Encounter Diagnoses   Name Primary?    Post-COVID chronic decreased mobility and endurance Yes    Post-COVID chronic concentration deficit     Post-COVID chronic fatigue      Folliculitis      See Below     No orders of the defined types were placed in this encounter.      I personally spent 45 total minutes in face-to-face and non-face-to-face activities related to the  patient's visit today, excluding any separately reportable services/procedures, specifically:    Preparing to see patient   Obtaining and/or reviewing history  Performing a medically appropriate examination  Counseling and/or education  Ordering tests, medications, or procedures  Documenting  clinical information in the record  Independent interpretation of results (not separately reported)  Communication of results  Care coordination    The patient has an established relationship with whom I am providing ongoing longitudinal care related to serious conditions and/or complex health conditions.     Return in about 6 months (around 12/25/2023).    Patient Instructions   Gordon Huang Lanius are slowly improving.    Continue your healthy lifestyle and   Take 7 days of Doxycycline  antibiotic twice a day for the scalp folliculitis infection.  If not better, see your primary care physician.    Follow up with us  in the Long Covid Clinic at [facility] in 6 months     Thank you.      - Dr. Leatrice   Diagnosis, available results of Labs/tests (if any) reviewed with patient and any questions answered.  Barriers to Learning assessed: none. Patient verbalizes understanding of teaching and instructions.    Relevant goal-setting conducted today for above condition(s).    See follow up as entered into the EPIC field    Electronically signed by  Toribio Leatrice MD

## 2023-06-25 ENCOUNTER — Encounter (INDEPENDENT_AMBULATORY_CARE_PROVIDER_SITE_OTHER): Payer: Self-pay | Admitting: Hospital

## 2023-06-25 ENCOUNTER — Telehealth (INDEPENDENT_AMBULATORY_CARE_PROVIDER_SITE_OTHER): Payer: Self-pay | Admitting: Pulmonary Disease

## 2023-06-25 DIAGNOSIS — G4733 Obstructive sleep apnea (adult) (pediatric): Secondary | ICD-10-CM

## 2023-06-25 NOTE — Telephone Encounter (Signed)
 It looks like order was placed under long covid clinic, doesn't look it was ever processed by anyone.    Please review and place under our WQ as STAT order.

## 2023-06-25 NOTE — Addendum Note (Signed)
 Addended by: CESARIO SLOUGH C on: 06/25/2023 01:30 PM     Modules accepted: Orders

## 2023-06-25 NOTE — Telephone Encounter (Signed)
Got it, will do!

## 2023-06-25 NOTE — Telephone Encounter (Signed)
 Please schedule for a new interventional evaluation    Gordon Huang  Medical Assistant/Front Office Coordinator   Interventional Psychiatry

## 2023-06-25 NOTE — Telephone Encounter (Signed)
-----   Message from Jon Otelia Flatten sent at 06/24/2023  5:47 PM PDT -----  Regarding: following up on equipment order  HI,   APAP ordered for patient in DEC 2024 but he has not received it yet.    Also was referred for sleep medicine but was told no appointments for 2 years???    Yours truly,  Dr. Flatten

## 2023-06-30 ENCOUNTER — Encounter (INDEPENDENT_AMBULATORY_CARE_PROVIDER_SITE_OTHER): Payer: Self-pay | Admitting: Hospital

## 2023-07-01 ENCOUNTER — Encounter (INDEPENDENT_AMBULATORY_CARE_PROVIDER_SITE_OTHER): Payer: Self-pay

## 2023-07-04 ENCOUNTER — Encounter: Payer: Self-pay | Admitting: Hospital

## 2023-07-07 NOTE — Telephone Encounter (Signed)
 Leoma calling from [facility] in regards referral request for CPAP and supplies requested on 07/02/2023. Per Leoma, there is an existing authorization under Referral [id number] with valid dates 06/26/2023-12/23/2023 For CPAP and supplies. Leoma is asking to please confirm if new referral was a Duplicate or not and contact her back to clarify ASAP as the TAT for them to process ends tomorrow. Please assist, thank you.      Ph. [phone] okay to leave detailed message

## 2023-07-08 NOTE — Telephone Encounter (Signed)
 Ms. Leoma is calling in regards to encounter below. Per Ms. Janina received order is due today 6/11. Requesting a call today to be advised on how to proceed. Please assist. Thank you     Ph. [phone]   okay to leave detailed message

## 2023-07-20 ENCOUNTER — Encounter (INDEPENDENT_AMBULATORY_CARE_PROVIDER_SITE_OTHER): Payer: Self-pay | Admitting: Rehabilitative and Restorative Service Providers"

## 2023-07-20 ENCOUNTER — Ambulatory Visit (INDEPENDENT_AMBULATORY_CARE_PROVIDER_SITE_OTHER): Admitting: Rehabilitative and Restorative Service Providers"

## 2023-07-20 DIAGNOSIS — G5603 Carpal tunnel syndrome, bilateral upper limbs: Secondary | ICD-10-CM

## 2023-07-20 DIAGNOSIS — G5600 Carpal tunnel syndrome, unspecified upper limb: Secondary | ICD-10-CM

## 2023-07-20 DIAGNOSIS — M25512 Pain in left shoulder: Secondary | ICD-10-CM

## 2023-07-20 NOTE — Progress Notes (Signed)
 [place] DEPARTMENT OF ORTHOPAEDIC SURGERY, SPORTS MEDICINE    PRIMARY CARE PROVIDER:   Dy, Diane Jazmin    Procedure:   Left shoulder arthroscopy, anterior labral repair, capsulorrhaphy,  remplissage that is repair of infraspinatus tendon into Hill-Sachs lesion.     DOS:   November 19, 2022    HISTORY: Gordon Huang is a 33 year old male who presents to clinic today for evaluation of left shoulder pain.  He is now 8 months postop.  He is doing well.  He did have to take a few weeks off of physical therapy due to illness but has a session later this week.  He is noting improvements in range of motion and strength.  He is doing a workup with Rheumatology for possible connective tissue disorder and has questions regarding Ehlers-Danlos syndrome.  He also has complaints of bilateral carpal tunnel syndrome in his requesting referral to the hand clinic.  Overall he feels that his left shoulder is stable.  He is gradually increasing his activity to include baking, caring for his cats, and painting.    Past medical history, medications, allergies are unchanged.    PHYSICAL EXAM:  VITALS: There were no vitals taken for this visit.  GENERAL: A+Ox3. INAD. Well appearing and well groomed. Normal gait  MENTAL STATUS: Pleasant and cooperative. Alert and oriented x3 with normal mood and affect.  Vascular: Pulses are palpable 2+, capillary refills to digits are normal.  Skin: No wounds/lesions/ulcers. Skin warm & dry.  Respiratory: No respiratory distress    MUSCULOSKELETAL:    Shoulder: Left  Normal posture.  Shoulder: No swelling.  Incisions are healed  Range of Motion:  Forward 150 and improve.  Contralateral side 160.  External rotation 30.  Contralateral side 45.  Internal rotation sacrum.  Contralateral side L3.  Strength is 5/5 in the supraspinatus, infraspinatus, subscapularis.  Sensation intact to light touch in the bilateral upper extremities.  2+ radial pulses.    ==========    ASSESSMENT & PLAN: Gordon Huang  is a 33 year old male 8 months status post left shoulder arthroscopy, anterior labral repair, capsulorrhaphy, remplissage.  Overall, he is doing well.  We discussed that he does not have equal range of motion bilaterally and therefore he should continue to work on this slowly in physical therapy as not to develop laxity in his left shoulder.  We discussed the possibility of an underlying connective tissue disorder which is currently being worked up by Rheumatology.  We discussed that in the circumstances it is important to balance connective tissue disorders with strong dynamic structures to include the muscles that support the joints.  We discussed using low weights and higher repetition.  I would like the patient to continue with physical therapy and a new order was placed today in clinic for him to work on end ranges of motion and building strength.  He will come back and see me likely for a final appointment in 3 months.  With regards to his bilateral hand numbness referral for the hand clinic was placed.  He will continue with his cock-up wrist splint.  All other questions and concerns were addressed.     Patient verbalizes understanding of all discussions today, and all questions were answered satisfactorily.

## 2023-07-20 NOTE — Patient Instructions (Signed)
 What is EDS? - The Mertha Bullock Society

## 2023-07-30 ENCOUNTER — Encounter (INDEPENDENT_AMBULATORY_CARE_PROVIDER_SITE_OTHER): Payer: Self-pay | Admitting: Family Practice

## 2023-08-13 NOTE — Addendum Note (Signed)
 Addended by: CELINDA GRATE on: 08/13/2023 03:23 PM     Modules accepted: Orders

## 2023-08-13 NOTE — Addendum Note (Signed)
 Addended by: CELINDA GRATE on: 08/13/2023 03:24 PM     Modules accepted: Orders

## 2023-08-14 ENCOUNTER — Telehealth (INDEPENDENT_AMBULATORY_CARE_PROVIDER_SITE_OTHER): Payer: Self-pay | Admitting: Pulmonary Disease

## 2023-08-14 NOTE — Telephone Encounter (Signed)
 [employer]- [facility] is calling to request for a new order for a mask. Pt has called insurance to assist. Please advise and assist. Thank you       CB: [phone]   [facility] Fax: [phone]  Fax for urgent request: [phone]

## 2023-08-27 ENCOUNTER — Telehealth (HOSPITAL_BASED_OUTPATIENT_CLINIC_OR_DEPARTMENT_OTHER): Payer: Self-pay

## 2023-08-27 NOTE — Telephone Encounter (Signed)
 Incoming fax from:  [facility]  RE: Received referral for genetic counseling     Please review referral uploaded to media and advise on scheduling

## 2023-08-27 NOTE — Telephone Encounter (Signed)
 Dear referring health care provider:     Thank you for referring your patient Gordon Huang, Gordon Huang to the genetic counseling program at [facility]. Our office specializes in prenatal and preconception genetic counseling.      Hypermobile EDS (hEDS) and hypermobility spectrum disorder (HSD) are diagnosed via the Beighton Score and EDS Checklist. There is NO genetic test for hypermobile EDS. This is a simple and quick exam that does not need to be performed by a geneticist. If the patient scores between 3-6, and does not meet all of the criteria in sections 2 and 3, they can be considered to have Hypermobility Spectrum Disorder. If the patient scores 5 or more (prepubertal - 6 or more, over 80 years of age - 4 or more) and meets criteria in sections 2 (features A, B and C) and 3, they meet the criteria for hypermobile EDS. Please note, screening for hypermobility is not reliable in children younger than 7 or in women who are pregnant or postpartum.     See assessing joint hypermobility: https://www.ehlers-danlos.com/assessing-joint-hypermobility/  Checklist form: https://www.ehlers-danlos.com/wp-content/uploads/2019/09/hEDS-Dx-Criteria-checklist-1-Fillable-form.pdf     A referral to genetics for EDS is indicated IF the patient has any of the following to rule out classic or vascular EDS. Hypermobility AND  1) a family or personal history of aortic aneurysm, retinal detachment, lens dislocation, uterine/colon prolapse or rupture, or sudden unexplained cardiac death  2) cigarette paper (thin and stretched out) appearing scars, extremely hyperextensible skin (stretch skin over volar surface of the nondominant forearm: under 1.5 cm wnl, 1.5-2 cm typical for hypermobile EDS, over 2 cm), post-operative complications related to incisional hernias or abnormal healing of incision, or you can see their veins/capillaries through their velvety translucent skin  3) Congenital dislocation of the hips +,  multiple dislocations or subluxations, plus any of the fhx stated above (must also have number 1 or 2 above).     If the patient does not have any of the above, please cancel this referral.    We will not be contacting the patient. Please don't hesitate to contact us  at [phone] if you have any questions or concerns about this policy.     GREAT RESOURCES FOR PATIENTS  The Ehlers-Danlos Society, www.ehlers-danlos.com - CHECK OUT THE YOUTUBE VIDEOS  GeneReviews, www.genereviews.org, hypermobile type EDS entry by Dr. Kayla Lipps     Thank you,  Kaori Onozuka, Genetic Counseling Assistant    .

## 2023-09-23 ENCOUNTER — Telehealth (INDEPENDENT_AMBULATORY_CARE_PROVIDER_SITE_OTHER): Payer: Self-pay

## 2023-09-23 NOTE — Telephone Encounter (Signed)
 LVM patients [facility] appointment on 11/5 has been cancelled due to Dr. Leatrice being out of the office. Patient has been rescheduled for the next available appointment on 04/27/24 and placed on high priority cancellation list.

## 2023-10-20 ENCOUNTER — Encounter (INDEPENDENT_AMBULATORY_CARE_PROVIDER_SITE_OTHER): Payer: Self-pay | Admitting: Rehabilitative and Restorative Service Providers"

## 2023-10-20 ENCOUNTER — Ambulatory Visit (INDEPENDENT_AMBULATORY_CARE_PROVIDER_SITE_OTHER): Admitting: Rehabilitative and Restorative Service Providers"

## 2023-10-20 DIAGNOSIS — M25512 Pain in left shoulder: Secondary | ICD-10-CM

## 2023-10-20 NOTE — Progress Notes (Signed)
 [facility] DEPARTMENT OF ORTHOPAEDIC SURGERY, SPORTS MEDICINE    PRIMARY CARE PROVIDER:   Dy, Diane Jazmin    Procedure:   Left shoulder arthroscopy, anterior labral repair, capsulorrhaphy,  remplissage that is repair of infraspinatus tendon into Hill-Sachs lesion.     DOS:   November 19, 2022    HISTORY: Gordon Huang is a 33 year old male who presents to clinic today for evaluation of left shoulder pain.  The patient is now 11 months postop.  He is doing well.  He remains in [facility] [place].  They are working on strength.  He has started a pushup progression.  He denies any instability in his left shoulder.  He denies recurrent injury.  He does experience some muscular soreness.  Overall, he feels that he is doing well.    Past medical history, medications, allergies are unchanged.    PHYSICAL EXAM:  VITALS: There were no vitals taken for this visit.  GENERAL: A+Ox3. INAD. Well appearing and well groomed. Normal gait  MENTAL STATUS: Pleasant and cooperative. Alert and oriented x3 with normal mood and affect.  Vascular: Pulses are palpable 2+, capillary refills to digits are normal.  Skin: No wounds/lesions/ulcers. Skin warm & dry.  Respiratory: No respiratory distress    MUSCULOSKELETAL:    Shoulder: Left  Normal posture.  Shoulder: No swelling.  Incisions are healed  Range of Motion:  Forward flexion 160.  Contralateral side 170.  External rotation 45.  Contralateral side 45.  Internal rotation L3.  Contralateral side L3.  Strength is 5/5 in the supraspinatus, infraspinatus, subscapularis.  Sensation intact to light touch in the bilateral upper extremities.  2+ radial pulses.    ==========    ASSESSMENT & PLAN: Gordon Huang is a 33 year old male 10 months status post left shoulder arthroscopy, anterior labral repair, capsulorrhaphy, remplissage.  We discussed his recovery over the past 10 months in his progression with range of motion and strength building.  He has  done a nice job with slow and steady progression.  I do think that the patient would benefit from some ongoing physical therapy to work on overall strength.  He was provided a new PT order.  Given the fact that he has returned to most activities of daily living as well as his desired level of activity he can follow up as needed.  All other questions and concerns were addressed.     Patient verbalizes understanding of all discussions today, and all questions were answered satisfactorily.

## 2023-12-02 ENCOUNTER — Encounter (INDEPENDENT_AMBULATORY_CARE_PROVIDER_SITE_OTHER): Admitting: Family Practice

## 2023-12-02 ENCOUNTER — Encounter (INDEPENDENT_AMBULATORY_CARE_PROVIDER_SITE_OTHER): Admitting: Internal Medicine

## 2023-12-16 ENCOUNTER — Encounter (INDEPENDENT_AMBULATORY_CARE_PROVIDER_SITE_OTHER): Payer: Self-pay | Admitting: Rehabilitative and Restorative Service Providers"

## 2023-12-22 ENCOUNTER — Encounter (INDEPENDENT_AMBULATORY_CARE_PROVIDER_SITE_OTHER): Payer: Self-pay | Admitting: Internal Medicine

## 2023-12-22 ENCOUNTER — Telehealth (INDEPENDENT_AMBULATORY_CARE_PROVIDER_SITE_OTHER): Payer: Self-pay | Admitting: Internal Medicine

## 2023-12-22 NOTE — Telephone Encounter (Signed)
 LVM, informed patient that his [facility] appointment on 4/1 has been cancelled due to Dr. Leatrice being out of the office. The clinic is booked through May 2026. Once the schedule opens passed that date we will reach to schedule patient.

## 2023-12-28 ENCOUNTER — Telehealth (INDEPENDENT_AMBULATORY_CARE_PROVIDER_SITE_OTHER): Payer: Self-pay | Admitting: Internal Medicine

## 2023-12-28 NOTE — Telephone Encounter (Signed)
 I connected with patient and scheduled a return Gordon Huang appointment for 12/3.

## 2023-12-29 ENCOUNTER — Encounter (INDEPENDENT_AMBULATORY_CARE_PROVIDER_SITE_OTHER): Payer: Self-pay | Admitting: Internal Medicine

## 2023-12-29 ENCOUNTER — Other Ambulatory Visit: Attending: Internal Medicine

## 2023-12-29 ENCOUNTER — Telehealth (INDEPENDENT_AMBULATORY_CARE_PROVIDER_SITE_OTHER): Payer: Self-pay | Admitting: Internal Medicine

## 2023-12-29 DIAGNOSIS — R7689 Other specified abnormal immunological findings in serum: Secondary | ICD-10-CM | POA: Insufficient documentation

## 2023-12-29 DIAGNOSIS — U099 Post covid-19 condition, unspecified: Secondary | ICD-10-CM | POA: Insufficient documentation

## 2023-12-29 LAB — CBC WITH DIFF, BLOOD
ANC-Automated: 2.6 1000/mm3 (ref 1.6–7.0)
Abs Basophils: 0 1000/mm3 (ref <0.2)
Abs Eosinophils: 0.1 1000/mm3 (ref 0.0–0.5)
Abs Lymphs: 2.4 1000/mm3 (ref 0.8–3.1)
Abs Monos: 0.3 1000/mm3 (ref 0.2–0.8)
Basophils: 0.7 %
Eosinophils: 1.7 %
Hct: 45.3 % (ref 40.0–50.0)
Hgb: 15.7 g/dL (ref 13.7–17.5)
Imm Gran %: 0.4 % (ref <1)
Imm Gran Abs: 0 1000/mm3 (ref <0.1)
Lymphocytes: 44.2 %
MCH: 30.9 pg (ref 26.0–32.0)
MCHC: 34.7 g/dL (ref 32.0–36.0)
MCV: 89.2 um3 (ref 79.0–95.0)
MPV: 9 fL — ABNORMAL LOW (ref 9.4–12.4)
Monocytes: 5.4 %
Plt Count: 297 1000/mm3 (ref 140–370)
RBC: 5.08 mill/mm3 (ref 4.60–6.10)
RDW: 11.7 % — ABNORMAL LOW (ref 12.0–14.0)
Segs: 47.6 %
WBC: 5.4 1000/mm3 (ref 4.0–10.0)

## 2023-12-29 LAB — COMPREHENSIVE METABOLIC PANEL, BLOOD
ALT (SGPT): 54 U/L — ABNORMAL HIGH (ref 0–50)
AST (SGOT): 41 U/L (ref 0–50)
Albumin: 4.9 g/dL (ref 3.5–5.2)
Alkaline Phos: 111 U/L (ref 40–129)
Anion Gap: 12 mmol/L (ref 7–15)
BUN: 16 mg/dL (ref 6–20)
Bicarbonate: 26 mmol/L (ref 22–29)
Bilirubin, Tot: 0.63 mg/dL (ref <1.2)
Calcium: 10 mg/dL (ref 8.5–10.6)
Chloride: 102 mmol/L (ref 98–107)
Creatinine: 0.91 mg/dL (ref 0.67–1.17)
Glucose: 94 mg/dL (ref 70–99)
Potassium: 4.1 mmol/L (ref 3.5–5.1)
Sodium: 140 mmol/L (ref 136–145)
Total Protein: 7.8 g/dL (ref 6.0–8.0)
eGFR Based on CKD-EPI 2021 Equation: >60 mL/min/1.73 m2

## 2023-12-29 LAB — C-REACTIVE PROTEIN, BLOOD: CRP: <0.3 mg/dL (ref <0.5)

## 2023-12-29 LAB — IGG, BLOOD: IGG: 992 mg/dL (ref 700–1600)

## 2023-12-29 LAB — SED RATE, BLOOD: Sed Rate: 6 mm/h (ref 2–28)

## 2023-12-29 LAB — CORTISOL, BLOOD: Cortisol: 15.1 ug/dL

## 2023-12-29 NOTE — Telephone Encounter (Signed)
 Patient is calling in to request for lab order to be re submitted. Labs expired already and he has an appointment today at 10 am.     [phone]  Please assist   Thank you

## 2023-12-29 NOTE — Interdisciplinary (Signed)
Blood drawn from right arm with 21 gauge needle. 6 tubes taken.   Patient identity authenticated by Mayra Tal.

## 2023-12-30 ENCOUNTER — Other Ambulatory Visit (INDEPENDENT_AMBULATORY_CARE_PROVIDER_SITE_OTHER)

## 2023-12-30 ENCOUNTER — Ambulatory Visit (INDEPENDENT_AMBULATORY_CARE_PROVIDER_SITE_OTHER): Admitting: Family Practice

## 2023-12-30 ENCOUNTER — Ambulatory Visit (INDEPENDENT_AMBULATORY_CARE_PROVIDER_SITE_OTHER): Admitting: Internal Medicine

## 2023-12-30 VITALS — BP 117/76 | HR 92 | Temp 97.8°F | Ht 68.0 in | Wt 176.0 lb

## 2023-12-30 DIAGNOSIS — J320 Chronic maxillary sinusitis: Secondary | ICD-10-CM

## 2023-12-30 DIAGNOSIS — R7989 Other specified abnormal findings of blood chemistry: Secondary | ICD-10-CM

## 2023-12-30 DIAGNOSIS — R7689 Other specified abnormal immunological findings in serum: Secondary | ICD-10-CM | POA: Insufficient documentation

## 2023-12-30 DIAGNOSIS — G9332 Myalgic encephalomyelitis/chronic fatigue syndrome: Secondary | ICD-10-CM

## 2023-12-30 DIAGNOSIS — R41840 Attention and concentration deficit: Secondary | ICD-10-CM

## 2023-12-30 DIAGNOSIS — M329 Systemic lupus erythematosus, unspecified: Secondary | ICD-10-CM | POA: Insufficient documentation

## 2023-12-30 DIAGNOSIS — M25531 Pain in right wrist: Secondary | ICD-10-CM

## 2023-12-30 DIAGNOSIS — Z7409 Other reduced mobility: Secondary | ICD-10-CM

## 2023-12-30 DIAGNOSIS — J069 Acute upper respiratory infection, unspecified: Secondary | ICD-10-CM

## 2023-12-30 DIAGNOSIS — M25532 Pain in left wrist: Secondary | ICD-10-CM

## 2023-12-30 DIAGNOSIS — U099 Post covid-19 condition, unspecified: Secondary | ICD-10-CM

## 2023-12-30 LAB — C4, BLOOD
C4: 25 mg/dL (ref 10–40)
C4: 25 mg/dL (ref 10–40)

## 2023-12-30 LAB — LIVER PANEL, BLOOD
ALT (SGPT): 50 U/L (ref 0–50)
AST (SGOT): 29 U/L (ref 0–50)
Albumin: 4.7 g/dL (ref 3.5–5.2)
Alkaline Phos: 108 U/L (ref 40–129)
Bilirubin, Dir: 0.1 mg/dL (ref <=0.2)
Bilirubin, Tot: 0.36 mg/dL (ref <1.2)
Total Protein: 7.7 g/dL (ref 6.0–8.0)

## 2023-12-30 LAB — C3, BLOOD
C3: 140 mg/dL (ref 90–180)
C3: 131 mg/dL (ref 90–180)

## 2023-12-30 LAB — CPK-CREATINE PHOSPHOKINASE, BLOOD: CPK: 95 U/L (ref 0–175)

## 2023-12-30 LAB — ANA (ANTI-NUCLEAR AB), BLOOD: ANA (Anti-Nuclear Ab): POSITIVE

## 2023-12-30 LAB — TSH, BLOOD: TSH: 2.41 u[IU]/mL (ref 0.27–4.20)

## 2023-12-30 LAB — SSA, BLOOD: SSA Ab: <1.5 U/mL (ref <7.0)

## 2023-12-30 LAB — FREE THYROXINE, BLOOD: Free T4: 1.3 ng/dL (ref 0.93–1.70)

## 2023-12-30 LAB — ANA (ANTI-NUCLEAR-AB), INTERPRETATION

## 2023-12-30 LAB — ANTI-NEUTRO CYTOPLASMA AB, BLOOD: Anti-Neutro Cytoplasm Ab: NEGATIVE

## 2023-12-30 LAB — ANTI-NUCLEAR-AB-TITER, BLOOD: Anti-Nuclear Ab Titer: 1:80 {titer}

## 2023-12-30 MED ORDER — NASAFLO NETI POT NASAL WASH NA PACK: PACK | NASAL | 0 refills | Status: AC

## 2023-12-30 MED ORDER — NETI POT SINUS WASH 2300-700 MG NA KIT: 1.0000 | PACK | Freq: Two times a day (BID) | NASAL | 0 refills | Status: AC

## 2023-12-30 NOTE — Patient Instructions (Addendum)
 Gordon Huang,    Here are 3 videos that you can watch for the NeilMed Sinus Kit two to three times a day to rinse out your sinuses and speed up the healing and relief from your symptoms.  These are available over the counter at most drug stores & pharmacies.    Short & sweet video:  NeilMed demo    Two more:  [facility]  [place] [name], NP    Private physician video  Dr. Reta Toribio Nevin MD

## 2023-12-30 NOTE — Progress Notes (Signed)
 Gordon Huang     Return Integrative Consultation     Referred by: Self, no referral orders on file since initial consultation   Primary care physician:   Gordon Huang     SUBJECTIVE:  Gordon Huang is a 33 year old male who presents for an integrative Huang, for help with Long Covid No chief complaint on file.       Patient's most important issues for today's Huang:  And he now seeks feedback regarding his symptoms and conditions detailed below, and guidance regarding integrative, complementary, alternative, and natural methods he can also employ to help improve his health and reduce the risk of relapse after treatment, if that should be successful. he seeks feedback regarding his conventional treatment options as well.       Pre-Huang Chart Review:    Conducted, including, but not limited to:    06/24/23 [name] last Pulmonary Huang in Long Covid Clinic at [place]  ASSESSMENT AND PLAN  Gordon Huang is a 33 year old male  with fatigue, decreased endurance and exercise tolerance associated with post exertional malaise as well as cognitive dysfunction and problems with concentration and attention, confusion, forgetfulness, difficulty understanding what others are saying, reduced mental acuity, and mental fatigue      At his last Huang, we reviewed results of recent HST which demonstrated mild OSA  He has witnessed apnea and It is possible that HST underestimates severity of OSA  We ordered APAP 5 -15 cm H2O  Sleep referral placed.     He has not received equipment nor been scheduled for a consultation  We have followed up with Sleep clinic     He continues to have symptoms suggestive of reactive airways  We recommend continuing symbicort   and adding spiriva  1.25  Labs ordered to assess T2 phenotype     Mobility and endurance improved after pulmonary rehab    The plan was carefully reviewed verbally with the patient and also affirmed that the patient understood next  steps and follow up plan.     He has been referred to [facility] for RV/TOC to follow up on test results and response to therapy  06/24/23 My last Huang:     1.  Long Covid brain fog     Has tried prescription LDN (low dose naltrexone ):  Most helpful with his brain fog, e.g. significant difficulty reading things* for more than 2 minutes, has only a small improvement at a dose up to 4mg , then he  Had to go off the naltrexone  for Orthopedic shoulder surgery & recovery while on opiates - with [name], sees Gordon Huang now  Not much change while off the LDN  Is now quickly (every other week 0.5mg ) back up to 4mg  and finds it is helping again  Dilutes a 50mg  tablet in 50mL water (bitter) with a syringe (along with eating a sweet thing right after)     *Limited to reading difficulty:  (Can listen to audiobooks & music just fine vs. Movies, TV)     2.  Fatigue secondary to Long Covid:  After a month and a half stopped taking 2 each of Cordyceps/ Mycobrain pills due to cost  Within 2 weeks   A little more energy went from average 5-6/10 to 7-8/10       and  Stuttering over words much less (15% better)  Most frustrating thing he deals with and he never had before (always talked  fast)     3. Skin concerns:  About 4 months of one or two itchy spots on scalp  Is was also previously on thighs, comes  goes, dark red  No better with antibacterial soap or other creams     Folliculitis - minor      4.  Past medical history   Childhood: usually got much sicker than others in family and social network. Often we prescribed antibiotics   Had mononucleosis 3x     Family history biological brother also gets sick a lot just like him        My initial consultation Huang 01/12/23  HPI:  Dr. Sheilah Huang at [facility] prescribes his Naltrexone  - he refers to her as his Covid doctor     His primary symptoms of concern today are:  1.  Fatigue & hypersomnolence     2.  Shortness of breath always     3.  Irritable mood 2/2 to fatigue  ADHD      4.  Brain Fog - memory and also  Naltrexone  helped this but not fatigue or mood   Resumed 0.5 milligram Naltrexone      Told not to go over 4 milligrams/day by Dr. Burnet     ADHD and memory are worse too, LDN didn't help     5.  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 Max 1-2/month, most months none  Water: lots   Eating Habits: sporadic, no clear schedule like breakfast, lunch, and dinner      Exercise: limited to Pulm Rehab     Sleep: as above       Supplements: (in addition to prescribed medications)   All from [employer]:  Multivitamin One a day Men's  Magnesium unsure  if 400mg  Nature Made  Vitamin D3  (? 1,000 international units Unsure of dose) green bottle  ASSESSMENT & PLAN:       Encounter Diagnoses   Name Primary?    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     Patient Instructions   [name] are slowly improving.     Continue your healthy lifestyle and   Take 7 days of Doxycycline  antibiotic twice a day for the scalp folliculitis infection.  If not better, see your primary care physician.     Follow up with us  in the Long Covid Clinic at [facility] in 6 months      Thank you.       - Dr. Leatrice   06/24/23 Dr. Dorien last Rheumatology Huang in [facility] at [place]     A/P:  A) DEPRESSION/ANXIETY/BIPOLAR, PASC (brain fog, PEM, fatigue, arthralgia), HYPERMOBILITY: pt w/ 3 acute COVID infections c/b constellation of  sx all c/w PASC.  Was getting some improvement with LDN when he had to stop for shoulder surgery and opioid use. On LDN currently. Despite arthralgia, no clear e/o underlying inflammatory arthritis or CTD. Cont to have sleep disturbances w/ brain fog and fatigue. Some improvement with mushroom supplement but unable to afford it. Now having daily headache and some neuropathic sx. Has had frequent infections including sinus infections and could consider CVID. No other manifestations of AAV     -trial of Mg threonate for headache  -trial of vit B12 SL 2500mcg daily  -refer to neurology Dr. Alto for PASC/headache  -check ANA/SLE serologies given sicca sx, ANCA/MPO/PR3, IgG/A/M, as well morning cortisol  -ask psych re: safety of gabapentin for neuropathy  -asking about genetic testing for EDS; referred to EDS foundation  -cont LDN  -pt wants to hold off on TMS for both depression and PASC but would like to meet w/ them to discuss risks/benefits  -interested in peer support group  -Interested in Long COVID registry  -pt already failed cymbalta due to AEs; would not rec elavil given multiple other psych meds and underlying psych history  -d/w'ed Drs. Wang/Corgan Mormile     40 min spent on patient care/record review.        Devere JINNY Ruth, M.D.   MyChart message:  Me to [name] Mungo [facility] 07/30/23  7:35 AM  Hi [name] Slice that you continue to improve in all aspects of health  Just wanted to you know that I wasn't able to invite you to get discounted supplements through [employer] because your email address is already associated with another healthcare provider through [employer].      Hoping that you can use that discount for purchasing the Cordyceps/mycobrain capsules from [employer] - and any other supplements that you may find useful.     - Rolan Nevin, MD     Last read by [name] Mungo Font at 4:03PM on 08/14/2023.          ==========================    HPI Today 12/30/2023:    1.  Fatigue   Was trending slightly better over the summer.     This AM  got approval for EDS genetic testing, will soon do    Now taking CoQ-10 (Coenzyme Q-10/ ubiquinol)  Magnesium  vit B12 SL 2500mcg daily  Mens multivitamin and  Collagen (helps with skin)    2.  Brain fog / neurocognitive symptoms   severely worse x3 weeks - See Below   Feels will be able to afford the supplements soon    3.  Respiratory  Shortness of breath went from tolerable to terrible since he got a cold 3 weeks ago  Day 3 was the peak and sustained since  Now less cough and runny nose but for 2 weeks was on the couch only, not able to get up and decorate the trees    Was only yesterday able to get outdoors for a few minutes   Breathless just talking  Sleep also worse, max 6 hours  Brain fog also severely worse    Only able to nap twice in past 3 weeks    4.  Scalp rash improved, cleared up for a week or so, and returned 2 weeks later  Awaiting to see a Dermatologist (+ referred). Pending      History of Present Illness             Social History, Inclusive:  He finally qualified after 2-1/2 for SSDI due to Long Covid  His 109 year old sister is now widowed, her husband died suddenly in Aug 14, 2023 from a probably aneurysm, found by their 44 year old son  [name]     High Risk Behaviors:  None; See Below for habits as in EPIC fields       Outpatient Medications Marked as Taking for the 12/30/23 encounter (Office Huang) with Leatrice Toribio Ade, MD   Medication Sig Dispense Refill    (TRUVADA) emtricitabine  200 mg-tenofovir  disoproxil 300 mg tablet Take 1 tablet by mouth daily. (Patient not taking: Reported on 01/04/2024)      budesonide -formoterol  (SYMBICORT ) 160-4.5 MCG/ACT inhaler SYMBICORT  (BUDESONIDE -FORMOTEROL ) 2 puffs twice a day and in addition, 1-2 puffs up to every 6 hrs as needed for cough, wheezing, chest tightness or shortness of breath. Not to exceed 12 puffs daily. 10 g 4    buPROPion  (WELLBUTRIN  XL) 300 MG XL tablet Take 1 tablet (300 mg) by mouth daily.       lamoTRIgine  (LAMICTAL ) 150 MG tablet Take 1 tablet (150 mg) by mouth 2 times daily.      LORazepam  (ATIVAN ) 1 MG tablet Take 1 tablet (1 mg) by mouth every 4 hours as needed for Anxiety.      Methylphenidate  HCl ER, OSM, 72 MG TBCR Take 54 mg by mouth daily. (Patient not taking: Reported on 01/04/2024)      naloxone  (NARCAN ) 4 mg/0.1 mL nasal spray FOR SUSPECTED OVERDOSE, call 911! Tilt head, spray one spray into one nostril as needed for respiratory depression. If patient does not respond or responds and then relapses, repeat using a new nasal spray TO THE OTHER NOSTRIL every 3 minutes until emergency medical assistance arrives. 2 each 0    naltrexone  (DEPADE) 50 MG tablet Take 1 tablet (50 mg) by mouth. Compound into 50mL of water and 50mg  = 4ml      Spacer/Aero-Holding Chambers (AEROCHAMBER PLUS-FLOW SIGNAL) MISC Use as directed with inhaler 1 each 1    tiotropium (SPIRIVA  RESPIMAT) 1.25 MCG/ACT AERS Inhale 2 puffs by mouth daily. 4 g 11        Allergies to medications:  Beef extract and Nsaids  Past Medical History[1]  Past Surgical History[2]  Family History[3]  Social History     Socioeconomic History    Marital status: Divorced     Spouse name: Not on file    Number of children: Not on file    Years of education: Not on file    Highest education level: Not on file   Occupational History    Not on file   Tobacco Use    Smoking status: Former     Average packs/day: 0.5 packs/day for 11.0 years (5.5 ttl pk-yrs)     Types: Cigarettes     Start date: 2006     Quit date: 2017     Years since quitting: 8.9     Passive exposure: Past    Smokeless tobacco: Former    Tobacco comments:     Mother smoked   Vaping Use    Vaping status: Former    Substances: Nicotine, THC   Substance and Sexual Activity    Alcohol use: Yes     Alcohol/week: 2.0 standard drinks of alcohol     Types: 2 Glasses of wine per week    Drug use: Yes     Types: Marijuana    Sexual  activity: Yes     Partners: Male   Other Topics Concern    Military  Service No    Blood Transfusions No    Caffeine Concern No    Occupational Exposure No    Hobby Hazards No    Sleep Concern Yes    Stress Concern Yes    Weight Concern No    Special Diet Yes     Comment: Pescatarian    Back Care Yes    Exercises Regularly Yes    Bike Helmet Use Yes    Seat Belt Use Yes    Performs Self-Exams No   Social History Narrative    Born and raised:[place], moved to [place] end of 2019    Pets:6 cats in his household    Coffee:1 cup daily    Carbonated beverages:less than once a week, special events only        Dinner: lunch is around 5-6, 11 at night    Azi:jmnlwi 2 am    Snacks?:something small so that he can take his medication     Social Drivers of Psychologist, Prison [employer] Probation Services Strain: Not on file   Food Insecurity: Unknown (01/29/2022)    Received from [employer] Insecurity     In the past 3 months, have you had to go without food for 24 hours, multiple times due to lack of resources?: Not on file   Transportation Needs: Unknown (01/29/2022)    Received from [employer] Needs     In the past 3 months, has lack of transporation kept you from medical appointments or getting things you need that are essential to your health?: Not on file   Physical Activity: Sufficiently Active (11/20/2017)    [facility]    Exercise Vital Sign     Days of Exercise per Week: 4 days     Minutes of Exercise per Session: 60 min   Stress: Not on file   Social Connections: Unknown (03/20/2022)    Received from [facility]    Social Connections     In the past 3 months, do you feel that you lack companionship or social support?: Not on file   Intimate Partner Violence: Not on file   Housing Stability: Not on file       I did review the available past medical, family, and social history from the electronic records.       OBJECTIVE:  BP 117/76   Pulse 92   Temp 97.8 F (36.6 C) (Temporal)   Ht 5' 8 (1.727 m)   Wt 79.8 kg (176 lb)   SpO2 99%   BMI 26.76 kg/m  Body  mass index is 26.76 kg/m.  Well Developed, Well nourished male in No Acute Distress  Mood & Affect are normal  Head: normocephalic, atraumatic  Eyes: anicteric, conj and lids normal OU  Ears: External Canals and TM's normal  Nose: Mucus membranes +2/4 inflamed, no discharge, no sinus tenderness to percussion   Oro: Uvula midline, tongue, gums, posterior pharynx & tonsils normal  Neck : supple, no anterior or posterior adenopathy, thyroid  exam without nodules or asymmetry, not enlarged  CV: S1S2 rate noted, regular, no murmur, no S3 or S4 gallop noted  Lungs: RR noted, complete sentences, no respiratory distress, breath sounds symmetrical and otherwise normal bilaterally  Neuro: Oriented, alert, cooperative, Gait: normal  Skin: exposed areas free of rash and no significant lesions  Physical Exam           Pertinent Labs & Imaging, other studies / procedures: reviewed      ASSESSMENT & PLAN:  Encounter Diagnoses   Name Primary?    Post-COVID chronic fatigue Yes    Post-COVID chronic decreased mobility and endurance     Post-COVID chronic concentration deficit     Maxillary sinusitis, unspecified chronicity     Comment: resolving     URI, acute        Large numbers of patients who have been infected with SARS-CoV-2 continue to experience a constellation of physical, cognitive and psychological symptoms weeks to months after recovering from the initial stages of COVID-19 illness. These symptoms, which can collectively be referred to as long COVID  include fatigue, shortness of breath, brain fog, sleep disorders, fevers, gastrointestinal symptoms, anxiety, and depression. Patients with Long COVID condition can demonstrate deficits across a wide array of high-level cognitive functions, including sustained attention, cognitive flexibility, and memory as well as processing speed or general cognitive slowing     In some cases, new symptoms arise well after the time of infection and often evolve over time. Symptoms  can range from mild to incapacitating, resulting in significant functional as well as financial disability     Given the nonspecific nature fo the symptoms and the lack of definitive biomarkers, the diagnosis of long Covid is made by a combination of clinical criteria. In particular, COVID infection can unmask, trigger or exacerbate pre-existing condition(s).     Treatments for PASC are limited, and focus on symptom management as well as comprehensive physical, cardio, pulmonary, and neurocognitive rehab.     Assessment & Plan        Detailed case discussed in collaboration w Dr. Jama today per protocol in the [facility]     No orders of the defined types were placed in this encounter.      I personally spent 55 total minutes in face-to-face and non-face-to-face activities related to the  patients Huang today, excluding any separately reportable services/procedures, specifically:    Preparing to see patient   Obtaining and/or reviewing history  Performing a medically appropriate examination  Counseling and/or education  Ordering tests, medications, or procedures  Documenting clinical information in the record  Independent interpretation of results (not separately reported)  Communication of results  Care coordination    The patient has an established relationship with whom I am providing ongoing longitudinal care related to serious conditions and/or complex health conditions.     No follow-ups on file.    Patient Instructions   [name],    Here are 3 videos that you can watch for the NeilMed Sinus Kit two to three times a day to rinse out your sinuses and speed up the healing and relief from your symptoms.  These are available over the counter at most drug stores & pharmacies.    Short & sweet video:  NeilMed demo    Two more:  [facility]  [place] [name], NP    Private physician video  Dr. Reta Toribio Nevin MD   Diagnosis, available results of Labs/tests (if any) reviewed with patient and any questions answered.   Barriers to Learning assessed: none. Patient verbalizes understanding of teaching and instructions.    Relevant goal-setting conducted today for above condition(s).    See follow up as entered into the EPIC field    Electronically signed by  Toribio Nevin MD         [  1]   Past Medical History:  Diagnosis Date    ADHD     Asthma early childhood, came back after COVID    Bipolar 1 disorder (CMS-HCC)     Chronic fatigue syndrome with fibromyalgia Covid 2023    Depression     Hyperlipidemia Late 2023, early 2024    Irritable bowel syndrome     Memory disorder COVID 2023    Personal history of self-harm     Suicide attempt     Several times over the years - I am stable now   [2] No past surgical history on file.  [3]   Family History  Problem Relation Name Age of Onset    Asthma Father Nickolis Diel     COPD Father Alf Doyle     Other Father Levander Font         TTP    Arthritis Father Ray Font     Clotting Disorder Father Levander Font         Thrombotic Thrombocytopenic Purpura (TTP)    Diabetes Father Levander Font     ADHD Mother Channing Pandy     Alcohol/Drug Mother Channing Pandy     Anxiety Mother Channing Pandy     Asthma Mother Channing Pandy     Bipolar Disorder Mother Channing Pandy     Depression Mother Channing Pandy     Heart Disease P Grandfather Ray Creed Sr.     Neurological Disease P Grandfather Ray Koral Sr.

## 2023-12-30 NOTE — Progress Notes (Deleted)
 [facility] DEPARTMENT OF ORTHOPAEDIC SURGERY, SPORTS MEDICINE    PRIMARY CARE PROVIDER:   Dy, Diane Jazmin    Procedure:   Left shoulder arthroscopy, anterior labral repair, capsulorrhaphy,  remplissage that is repair of infraspinatus tendon into Hill-Sachs lesion.     DOS:   November 19, 2022    HISTORY: Gordon Huang is a 33 year old male who presents to clinic today for evaluation of left shoulder pain.  The patient is now 11 months postop.  He is doing well.  He remains in physical therapy at [facility] [place].  They are working on strength.  He has started a pushup progression.  He denies any instability in his left shoulder.  He denies recurrent injury.  He does experience some muscular soreness.  Overall, he feels that he is doing well.    Past medical history, medications, allergies are unchanged.    PHYSICAL EXAM:  VITALS: There were no vitals taken for this visit.  GENERAL: A+Ox3. INAD. Well appearing and well groomed. Normal gait  MENTAL STATUS: Pleasant and cooperative. Alert and oriented x3 with normal mood and affect.  Vascular: Pulses are palpable 2+, capillary refills to digits are normal.  Skin: No wounds/lesions/ulcers. Skin warm & dry.  Respiratory: No respiratory distress    MUSCULOSKELETAL:    Shoulder: Left  Normal posture.  Shoulder: No swelling.  Incisions are healed  Range of Motion:  Forward flexion 160.  Contralateral side 170.  External rotation 45.  Contralateral side 45.  Internal rotation L3.  Contralateral side L3.  Strength is 5/5 in the supraspinatus, infraspinatus, subscapularis.  Sensation intact to light touch in the bilateral upper extremities.  2+ radial pulses.    ==========    ASSESSMENT & PLAN: Gordon Huang is a 33 year old male 10 months status post left shoulder arthroscopy, anterior labral repair, capsulorrhaphy, remplissage.  We discussed his recovery over the past 10 months in his progression with range of motion and strength building.  He has  done a nice job with slow and steady progression.  I do think that the patient would benefit from some ongoing physical therapy to work on overall strength.  He was provided a new PT order.  Given the fact that he has returned to most activities of daily living as well as his desired level of activity he can follow up as needed.  All other questions and concerns were addressed.     Patient verbalizes understanding of all discussions today, and all questions were answered satisfactorily.

## 2023-12-30 NOTE — Progress Notes (Signed)
 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 COVID 08/2018, end of 2021, 11/2021 (2 other infections, 1st in AUG 2020--mild, like a bad cold, sick for a week and a half, then again end of 2021, sick for almost a month--fatigue, SOB, no taste or smell). c/b sig fatigue, PEM, neurocog sx (memory issues, confusion, mental acuity), decrease endurance, exercise intol. Mild OSA. Doing pulm rehab but some PEM. Likes going to pulm rehab. Hasn't been able to work. Not getting emotional support from dad. Applying for in home care. Feeling down. Hypersomnolence and worsening depression/anxiety. Sees therapist 2x/wk. S/p surgery for prior broken shoulder and fall exacerbating his sx. Occ dry rash/bumps at fingers. No oral ulcers/alopecia. +occ dry eyes but no dry eyes. +SOB. Chest tightness. ?mild RP w/ white/red feelings. Needs circulating air to sleep at night. No photosensitivity. Some joint pain at R>L knees. Right arm feels tired, been using it more after left shoulder surgery. Feels like he has to crack his joints all the time (knees/neck). +CLBP ever since breaking it 2015 (L2-4 compression fracture) during depression/situational things. 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 Still has brain fog and fatigue. 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 Online and self-pacing. C/o cold/clammy hands and feeling cold. +RP. Has nerve pain on right 2nd/4th hand and top of left foot associated with some muscle spasm. Also intermittent red spots on face under the eye and thighs. Some numbness/tingling right hand, happening more frequently, lasting up to a day. Pt wondering about EDS. Stretchy skin and super flexible all his life. Able to fall asleep midnight. Difficulty staying asleep. Can wake up anytime between 6a/noon/3am. Unrefreshing sleep.  Psych wants to hold off on TMS. Wants to wait until neuropsych testing completed. Daily headache x 1 yr. Oral ulcers, sicca. Frequent infections as a child w/ sinus infections; slowed down early 20s. No recurrent GERD. No alopecia. No DVT/PE    Continuing to take LDN at home. Compounding himself at home. Used to get relief w/ mushroom supplement but unable to afford it due to financial constraint. Just got approved for SSI for Long COVID and will start getting disability. Recent sudden death in his BIL this past summer in [place]. Was improving until 3 weeks ago when he got a cold/sinus infection associated with SOB. Been using MDI prn. Persistent dry eyes and mouth. Still has pain at knees. Pending appt for CTS. 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 Gets intermittent rash on thigh (pending derm). None today. Psych said it is not safe to add gabapentin to his regimen. Persistent wrist pain/hand pain. Using wrist brace to sleep. Frequent oral ulcers.     ROS: 14-point ROS was negative other than as noted above.    PMH:  Left shoulder surgery/arthroscopy 10/2022 for broken shoulder 2009  Bipolar  Anxiety  Depression w/ h/o SI  Acute dystonic reaction due to drugs 05/2022  SVT s/p ablation 2016  ADHD  Mono x 3  GERD/Barrett's esophagus s/p EGD 2015; s/p tx    Allergies:   Allergies   Allergen Reactions    Beef Extract Other and Itching     All Red-Meat Products    Nsaids Itching       Medications:   H/o LDN 4mg  04/2022, felt better but was off for shoulder surgery 10/2022; helps  w/ brain fog; restarted last week  H/o cymbalta -> sig wt gain and anger  H/o s-ketamine prior to surgery but had to stop   H/o Truvada as prophylaxis    Yeztugo q53months  coQ10/vit D3/collagen/Mg/MVI/vit B12  H/o Mushroom: helped with brain fog/energy but too expensive $60 and stoped  LDN 4mg  qdaily (this dose 2.2025, restarted 12/2022)  Lamictal   Wellbutrin   Naprosyn   Methylphenidate /concerta     Current Outpatient Medications on File Prior to Visit   Medication Sig Dispense  Refill    (TRUVADA) emtricitabine  200 mg-tenofovir  disoproxil 300 mg tablet Take 1 tablet by mouth daily.      budesonide -formoterol  (SYMBICORT ) 160-4.5 MCG/ACT inhaler SYMBICORT  (BUDESONIDE -FORMOTEROL ) 2 puffs twice a day and in addition, 1-2 puffs up to every 6 hrs as needed for cough, wheezing, chest tightness or shortness of breath. Not to exceed 12 puffs daily. 10 g 4    buPROPion  (WELLBUTRIN  XL) 300 MG XL tablet Take 1 tablet (300 mg) by mouth daily.      [DISCONTINUED] doxyCYCLINE  (VIBRAMYCIN ) 100 MG tablet Take 1 tablet (100 mg) by mouth 2 times daily. For 7 days for scalp infection (Patient not taking: Reported on 12/30/2023) 14 tablet 0    lamoTRIgine  (LAMICTAL ) 150 MG tablet Take 1 tablet (150 mg) by mouth 2 times daily.      LORazepam  (ATIVAN ) 1 MG tablet Take 1 tablet (1 mg) by mouth every 4 hours as needed for Anxiety.      Methylphenidate  HCl ER, OSM, 72 MG TBCR Take 54 mg by mouth daily.      naloxone  (NARCAN ) 4 mg/0.1 mL nasal spray FOR SUSPECTED OVERDOSE, call 911! Tilt head, spray one spray into one nostril as needed for respiratory depression. If patient does not respond or responds and then relapses, repeat using a new nasal spray TO THE OTHER NOSTRIL every 3 minutes until emergency medical assistance arrives. 2 each 0    naltrexone  (DEPADE) 50 MG tablet Take 1 tablet (50 mg) by mouth. Compound into 50mL of water and 50mg  = 4ml      Spacer/Aero-Holding Chambers (AEROCHAMBER PLUS-FLOW SIGNAL) MISC Use as directed with inhaler 1 each 1    tiotropium (SPIRIVA  RESPIMAT) 1.25 MCG/ACT AERS Inhale 2 puffs by mouth daily. 4 g 11     No current facility-administered medications on file prior to visit.         FHx: No known autoimmune disease. TTP-dad  family history includes ADHD in his mother; Alcohol/Drug in his mother; Anxiety in his mother; Arthritis in his father; Asthma in his father and mother; Bipolar Disorder in his mother; COPD in his father; Clotting Disorder in his father; Depression in his  mother; Diabetes in his father; Heart Disease in his p grandfather; Neurological Disease in his p grandfather; Other in his father.    SocHx:    reports that he quit smoking about 8 years ago. His smoking use included cigarettes. He started smoking about 19 years ago. He has a 5.5 pack-year smoking history. He has been exposed to tobacco smoke. He has quit using smokeless tobacco. He reports current alcohol use of about 2.0 standard drinks of alcohol per week. He reports current drug use. Drug: Marijuana.    Physical Exam:   Vital Signs:   BP 117/76   Pulse 92   Temp 97.8 F (36.6 C) (Temporal)   Ht 5' 8 (1.727 m)   Wt 79.8 kg (176 lb)   SpO2 99%   BMI 26.76 kg/m  GEN: NAD.   CV: RRR, No M/R/G.  Chest: CTAB.  Abd: S/NT/ND.  Ext: No C/C/E.   MSK: No active synovitis. +tinel's on right hand  Skin: No rash.       Labs: [] reviewed [] ordered.  Lab Results   Component Value Date    WBC 5.4 12/29/2023    HGB 15.7 12/29/2023    HCT 45.3 12/29/2023    PLT 297 12/29/2023     Lab Results   Component Value Date    CREAT 0.91 12/29/2023    AST 41 12/29/2023    ALT 54 (H) 12/29/2023     No components found for: TERENCE TIMOTEO MAGNUS, RBCSUR, WBCSUR  Lab Results   Component Value Date    CRP <0.30 12/29/2023     No results found for: C3, C4, DRVVT, SMAB, SSA, CANCA, PANCA, MPOAB      Studies: [] reviewed [] ordered [] independently reviewed.  01/2022 MRI brain neg     01/12/23 nl tsh/ft4, vit B12 655, cortisol 5.3 at 3p, wbc 4.7 h/h 16/45 plt 313  12/29/23 bun/cr 16/0.9, ast/alt 41/54, alb 4.9, tp 7.8, cortisol 15, crp <0.3, esr 6, IgG 992, wbc 5.4 h/h 15.7/45 plt 297, ANA 1:80 speckled, neg ANCA  12/30/23 nl lft/FT4/CPK/tsh, neg TPO, neg dsdna/SSB/A/Smith/RNP; nl c3/c4    A/P:  A) DEPRESSION/ANXIETY/BIPOLAR, PASC (brain fog, PEM, fatigue, arthralgia), HYPERMOBILITY, +ANA: pt w/ 3 acute COVID infections c/b constellation of sx all c/w PASC. Was getting some improvement with LDN when he had to  stop for shoulder surgery and opioid use. On LDN currently. Despite arthralgia/RP, no clear e/o underlying inflammatory arthritis or CTD. Cont to have sleep disturbances w/ brain fog and fatigue. Some improvement with mushroom supplement but unable to afford it. Now having daily headache and some neuropathic sx. Has had frequent infections including sinus infections and could consider CVID. No other manifestations of AAV. Low title ANA 1:80 but all negative subsequent ENA. Lft normalized.. his hand pain seems more c/w CTS    Large numbers of patients who have been infected with SARS-CoV-2 continue to experience a constellation of symptoms long past the time that they've recovered from the initial stages of COVID-19 illness. Often referred to as "Long COVID", these symptoms, which can include fatigue, shortness of breath, "brain fog", sleep disorders, fevers, gastrointestinal symptoms, anxiety, and depression, can persist for months and can range from mild to incapacitating. In some cases, new symptoms arise well after the time of infection or evolve over time.   While still being defined, these effects can be collectively referred to as Post-Acute Sequelae of SARS-CoV-2 infection (PASC).  Treatments for PASC are limited, and focus on symptom management and comprehensive physical and respiratory rehab.      -check emg/ncv to assess for CTS  -f/up with psych if lft abnl given lamictal  use  -psych does not feel safe adding gabapentin for neuropathy to his current regimen  -pending genetic testing for EDS  -cont LDN  -pt wants to hold off on TMS for both depression and PASC but would like to meet w/ them to discuss risks/benefits  -interested in peer support group  -signed up for Long COVID registry  -pt already failed cymbalta due to AEs; would not rec elavil given multiple other psych meds and underlying psych history  -d/w'ed Dr. Leatrice    The patient has an established relationship with whom I am providing ongoing  longitudinal care related to serious conditions and/or complex rheumatologic conditions.     40  min spent on patient care/record review.      Devere JINNY Ruth, M.D.

## 2023-12-30 NOTE — Patient Instructions (Signed)
 Scheduling Instructions (for hand clinic)    You have been referred to the Department of Orthopedics for a clinic consult. Please call to schedule an appointment at your earliest convenience.  Insurance varies from person to person and you may also need an insurance authorization prior to being seen.    Orthopedics Department scheduling phone numbers:    Orthopedics Scheduling Phone number:  [phone]    Locations:    [facility]:  [address]  [place], [place] [address]    [facility]):  [address]  [place], [place] [address]    [facility]):  [address]  [place], [place] [address]    [facility])  [address]  [place], [place] [address]    We look forward to serving your Orthopedics needs.

## 2023-12-31 ENCOUNTER — Encounter (INDEPENDENT_AMBULATORY_CARE_PROVIDER_SITE_OTHER): Admitting: Rehabilitative and Restorative Service Providers"

## 2023-12-31 ENCOUNTER — Encounter (INDEPENDENT_AMBULATORY_CARE_PROVIDER_SITE_OTHER): Payer: Self-pay | Admitting: Rehabilitative and Restorative Service Providers"

## 2023-12-31 ENCOUNTER — Inpatient Hospital Stay (INDEPENDENT_AMBULATORY_CARE_PROVIDER_SITE_OTHER): Admit: 2023-12-31 | Discharge: 2023-12-31 | Disposition: A

## 2023-12-31 DIAGNOSIS — M25512 Pain in left shoulder: Secondary | ICD-10-CM

## 2023-12-31 DIAGNOSIS — S42152S Displaced fracture of neck of scapula, left shoulder, sequela: Secondary | ICD-10-CM

## 2023-12-31 LAB — IGG SUBCLASS PANEL, BLOOD
IGG Subclass 1: 386 mg/dL (ref 240–1118)
IGG Subclass 2: 454 mg/dL (ref 124–549)
IGG Subclass 3: 76 mg/dL (ref 21–134)
IGG Subclass 4: 9 mg/dL (ref 1–123)

## 2023-12-31 LAB — THYROID PEROXIDASE AB
Thyroid Peroxidase AB: <15 [IU]/mL (ref 0.0–34.0)
Thyroid Peroxidase AB: <15 [IU]/mL (ref 0.0–34.0)

## 2023-12-31 LAB — ANTI-DSDNA, BLOOD: Anti-DSDNA: 2 [IU] (ref 0–24)

## 2024-01-01 ENCOUNTER — Encounter (INDEPENDENT_AMBULATORY_CARE_PROVIDER_SITE_OTHER): Payer: Self-pay | Admitting: Rehabilitative and Restorative Service Providers"

## 2024-01-01 LAB — SSB, BLOOD: SSB Ab: <1.1 U/mL (ref <7.0)

## 2024-01-01 LAB — RNP (RIBONUCLEICPROTEIN AB), BLOOD: RNP (Ribonucloprotein Ab): <2.7 U/mL (ref <5.0)

## 2024-01-01 LAB — SMITH ANTIBODY, BLOOD: Smith Ab: <1 U/mL (ref <7.0)

## 2024-01-04 ENCOUNTER — Encounter (INDEPENDENT_AMBULATORY_CARE_PROVIDER_SITE_OTHER): Payer: Self-pay | Admitting: Rehabilitative and Restorative Service Providers"

## 2024-01-04 ENCOUNTER — Ambulatory Visit (INDEPENDENT_AMBULATORY_CARE_PROVIDER_SITE_OTHER): Admitting: Rehabilitative and Restorative Service Providers"

## 2024-01-04 DIAGNOSIS — M25512 Pain in left shoulder: Secondary | ICD-10-CM

## 2024-01-04 NOTE — Progress Notes (Signed)
  DEPARTMENT OF ORTHOPAEDIC SURGERY, SPORTS MEDICINE    PRIMARY CARE PROVIDER:   Dy, Diane Jazmin    Procedure:   Left shoulder arthroscopy, anterior labral repair, capsulorrhaphy,  remplissage that is repair of infraspinatus tendon into Hill-Sachs lesion.     DOS:   November 19, 2022    HISTORY: Gordon Huang is a 33 year old male who presents to clinic today for evaluation of left shoulder pain. The patient states that approximately 2 months ago he developed clicking in his left shoulder. Initially this was not painful but then he began to notice increasing clicking with increasing pain. He denies any discrete instability event but the symptoms have worsened to a point that they are affecting his physical therapy sessions. He notes pain when doing the arm bike. He describes some pseudo symptoms of instability but no discrete instability event. He is here today at the request of his physical therapist as the current symptoms are affecting his ability to advance in physical therapy. He does note pain with activities of daily living. He has concerns that he may have done something significant to his shoulder. No other issues.    Past medical history, medications, allergies are unchanged.    PHYSICAL EXAM:  VITALS: There were no vitals taken for this visit.  GENERAL: A+Ox3. INAD. Well appearing and well groomed. Normal gait  MENTAL STATUS: Pleasant and cooperative. Alert and oriented x3 with normal mood and affect.  Vascular: Pulses are palpable 2+, capillary refills to digits are normal.  Skin: No wounds/lesions/ulcers. Skin warm & dry.  Respiratory: No respiratory distress    MUSCULOSKELETAL:    Shoulder: Left  Normal posture.  Shoulder: No swelling.  Incisions are healed  Range of Motion:  He continues to have a degree of stiffness in his shoulder. Forward flexion 160. Contralateral side 170. External rotation 40. Internal rotation L4. Left shoulder at 90/90 external rotation 20. This is significantly  different than the contralateral side and has been present since time of surgery.  Strength is 5/5 in the supraspinatus, infraspinatus, subscapularis.  Sensation intact to light touch in the bilateral upper extremities.  2+ radial pulses.      Radiographs: Radiographs performed of the left shoulder reviewed independently reveal good alignment. No fractures. No calcifications. The shoulder is well reduced.  ==========    ASSESSMENT & PLAN: Gordon Huang is a 33 year old male 13 months status post left shoulder arthroscopy, anterior labral repair, capsulorrhaphy, remplissage now with the development of clicking in the left shoulder. We discussed that the clicking and left shoulder is somewhat atypical at this point postoperatively. We discussed that it could be secondary to recurrent labral tearing verses hardware failure verses altered biomechanics in the left shoulder. Since his symptoms have been ongoing for the past 2 months and have not improved but rather worsened while in physical therapy I would like to proceed with an MRI of the left shoulder. In the meantime I would like him to continue with his home exercise program and physical therapy. I will see him back in the office after the MRI. He was pleased with today's discussion. All other questions and concerns were addressed.       Patient verbalizes understanding of all discussions today, and all questions were answered satisfactorily.

## 2024-01-04 NOTE — Patient Instructions (Signed)
 Instructions For MRI or CT    Barb Merino PA-C has ordered a MRI or CT scan for you. Please call [facility] Scheduling at [phone] to schedule an appointment at your earliest convenience. Once you have completed your MRI/CT please call the office at [phone] to discuss the results over the phone. If there are surgical findings you will then be referred back to the Sports Medicine office to see one of our Orthopedic Surgeons. If the findings on your MRI do not warrant surgery then we will discuss further treatment at that time which may require you to come back to clinic for other treatments.    Please allow 48-72 hours for review of you MRI/CT scan before a return phone call from my office to discuss the findings.    If you do not have your MRI/CT completed at [facility] you will need to bring a copy of the imaging and the report to the follow up visit.     **Patients with [employer], [employer], [employer], [employer] company will only authorize an UNLICENSED MRI facility.  [facility] has these outpatient MRI facilities available and we recommend you mention this to your the [facility] Scheduler:  [address] [place], [place] [address]   [facility], [phone], [place], [place] [address]   [facility], [address], [place] [address]

## 2024-01-05 NOTE — Telephone Encounter (Signed)
 Matter addressed at clinic visit on 01/04/2024

## 2024-01-26 ENCOUNTER — Encounter (INDEPENDENT_AMBULATORY_CARE_PROVIDER_SITE_OTHER): Admitting: Rehabilitative and Restorative Service Providers"

## 2024-02-05 ENCOUNTER — Encounter (INDEPENDENT_AMBULATORY_CARE_PROVIDER_SITE_OTHER): Payer: Self-pay | Admitting: Hospital

## 2024-02-05 NOTE — Telephone Encounter (Signed)
 Forward message from MD to patient, via MyChart

## 2024-02-05 NOTE — Telephone Encounter (Signed)
 Labs completed and all normal.

## 2024-02-11 ENCOUNTER — Inpatient Hospital Stay (INDEPENDENT_AMBULATORY_CARE_PROVIDER_SITE_OTHER): Admit: 2024-02-11 | Discharge: 2024-02-11 | Disposition: A | Discharge status: Home Health

## 2024-02-11 DIAGNOSIS — M67814 Other specified disorders of tendon, left shoulder: Secondary | ICD-10-CM

## 2024-02-11 DIAGNOSIS — M25512 Pain in left shoulder: Secondary | ICD-10-CM

## 2024-02-11 DIAGNOSIS — Z9889 Other specified postprocedural states: Secondary | ICD-10-CM

## 2024-02-12 ENCOUNTER — Encounter (INDEPENDENT_AMBULATORY_CARE_PROVIDER_SITE_OTHER): Payer: Self-pay | Admitting: Rehabilitative and Restorative Service Providers"

## 2024-04-27 ENCOUNTER — Encounter (INDEPENDENT_AMBULATORY_CARE_PROVIDER_SITE_OTHER): Admitting: Internal Medicine

## 2024-04-27 ENCOUNTER — Encounter (INDEPENDENT_AMBULATORY_CARE_PROVIDER_SITE_OTHER): Admitting: Family Practice
